# Patient Record
Sex: Female | Born: 1990
Health system: Southern US, Community
[De-identification: ages and names within clinical notes are randomized; demographics above are authoritative.]

## PROBLEM LIST (undated history)

## (undated) DIAGNOSIS — G43909 Migraine, unspecified, not intractable, without status migrainosus: Secondary | ICD-10-CM

## (undated) DIAGNOSIS — N649 Disorder of breast, unspecified: Secondary | ICD-10-CM

## (undated) DIAGNOSIS — D649 Anemia, unspecified: Secondary | ICD-10-CM

## (undated) DIAGNOSIS — F329 Major depressive disorder, single episode, unspecified: Secondary | ICD-10-CM

## (undated) DIAGNOSIS — N83209 Unspecified ovarian cyst, unspecified side: Secondary | ICD-10-CM

## (undated) DIAGNOSIS — Z5189 Encounter for other specified aftercare: Secondary | ICD-10-CM

## (undated) DIAGNOSIS — F32A Depression, unspecified: Secondary | ICD-10-CM

## (undated) HISTORY — DX: Disorder of breast, unspecified: N64.9

## (undated) HISTORY — DX: Encounter for other specified aftercare: Z51.89

## (undated) HISTORY — DX: Anemia, unspecified: D64.9

## (undated) HISTORY — DX: Migraine, unspecified, not intractable, without status migrainosus: G43.909

## (undated) HISTORY — DX: Depression, unspecified: F32.A

## (undated) HISTORY — DX: Major depressive disorder, single episode, unspecified: F32.9

---
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
## 2008-10-08 ENCOUNTER — Emergency Department: Payer: Self-pay | Admitting: Emergency Medicine

## 2008-10-25 ENCOUNTER — Emergency Department: Payer: Self-pay | Admitting: Emergency Medicine

## 2008-10-28 ENCOUNTER — Emergency Department: Payer: Self-pay | Admitting: Emergency Medicine

## 2008-12-12 ENCOUNTER — Emergency Department: Payer: Self-pay | Admitting: Emergency Medicine

## 2008-12-26 ENCOUNTER — Emergency Department: Payer: Self-pay | Admitting: Emergency Medicine

## 2009-04-05 ENCOUNTER — Emergency Department: Payer: Self-pay | Admitting: Internal Medicine

## 2009-04-06 ENCOUNTER — Emergency Department: Payer: Self-pay | Admitting: Emergency Medicine

## 2009-04-21 ENCOUNTER — Emergency Department: Payer: Self-pay | Admitting: Emergency Medicine

## 2009-04-24 ENCOUNTER — Emergency Department: Payer: Self-pay | Admitting: Emergency Medicine

## 2009-07-15 ENCOUNTER — Encounter: Payer: Self-pay | Admitting: Maternal & Fetal Medicine

## 2009-07-29 ENCOUNTER — Encounter: Payer: Self-pay | Admitting: Maternal & Fetal Medicine

## 2009-08-12 ENCOUNTER — Encounter: Payer: Self-pay | Admitting: Maternal & Fetal Medicine

## 2009-08-24 ENCOUNTER — Emergency Department: Payer: Self-pay | Admitting: Emergency Medicine

## 2011-05-29 ENCOUNTER — Emergency Department: Payer: Self-pay | Admitting: Emergency Medicine

## 2011-08-02 ENCOUNTER — Emergency Department: Payer: Self-pay | Admitting: Unknown Physician Specialty

## 2011-08-02 LAB — URINALYSIS, COMPLETE
Bacteria: NONE SEEN
Glucose,UR: NEGATIVE mg/dL (ref 0–75)
Ph: 5 (ref 4.5–8.0)
Protein: NEGATIVE
RBC,UR: 32 /HPF (ref 0–5)

## 2011-08-02 LAB — COMPREHENSIVE METABOLIC PANEL
Anion Gap: 6 — ABNORMAL LOW (ref 7–16)
BUN: 13 mg/dL (ref 7–18)
Bilirubin,Total: 0.2 mg/dL (ref 0.2–1.0)
Calcium, Total: 9.2 mg/dL (ref 8.5–10.1)
Chloride: 107 mmol/L (ref 98–107)
Creatinine: 0.84 mg/dL (ref 0.60–1.30)
EGFR (African American): >60
Osmolality: 283 (ref 275–301)
Potassium: 4.1 mmol/L (ref 3.5–5.1)
SGOT(AST): 20 U/L (ref 15–37)
Sodium: 142 mmol/L (ref 136–145)
Total Protein: 7.8 g/dL (ref 6.4–8.2)

## 2011-08-02 LAB — CBC WITH DIFFERENTIAL/PLATELET
Basophil %: 1.1 %
Eosinophil %: 2.7 %
HCT: 38.8 % (ref 35.0–47.0)
Lymphocyte #: 1.2 10*3/uL (ref 1.0–3.6)
MCV: 84 fL (ref 80–100)
Monocyte #: 0.3 x10 3/mm (ref 0.2–0.9)
Monocyte %: 9.3 %
Neutrophil #: 1.5 10*3/uL (ref 1.4–6.5)
RBC: 4.61 10*6/uL (ref 3.80–5.20)
RDW: 13.6 % (ref 11.5–14.5)
WBC: 3.1 10*3/uL — ABNORMAL LOW (ref 3.6–11.0)

## 2011-08-02 LAB — PREGNANCY, URINE: Pregnancy Test, Urine: NEGATIVE m[IU]/mL

## 2011-08-15 ENCOUNTER — Emergency Department: Payer: Self-pay | Admitting: Emergency Medicine

## 2011-08-15 LAB — COMPREHENSIVE METABOLIC PANEL
Albumin: 3.8 g/dL (ref 3.4–5.0)
Alkaline Phosphatase: 92 U/L (ref 50–136)
Bilirubin,Total: 0.3 mg/dL (ref 0.2–1.0)
Calcium, Total: 9.4 mg/dL (ref 8.5–10.1)
Chloride: 107 mmol/L (ref 98–107)
Co2: 28 mmol/L (ref 21–32)
Creatinine: 2.01 mg/dL — ABNORMAL HIGH (ref 0.60–1.30)
Osmolality: 287 (ref 275–301)
Sodium: 142 mmol/L (ref 136–145)

## 2011-08-15 LAB — URINALYSIS, COMPLETE
Bacteria: NONE SEEN
Glucose,UR: NEGATIVE mg/dL (ref 0–75)
Ketone: NEGATIVE
Nitrite: NEGATIVE
RBC,UR: 6 /HPF (ref 0–5)
Specific Gravity: 1.004 (ref 1.003–1.030)
WBC UR: 28 /HPF (ref 0–5)

## 2011-08-15 LAB — CBC
MCV: 84 fL (ref 80–100)
Platelet: 239 10*3/uL (ref 150–440)
RDW: 12.8 % (ref 11.5–14.5)
WBC: 7.9 10*3/uL (ref 3.6–11.0)

## 2011-08-15 LAB — PREGNANCY, URINE: Pregnancy Test, Urine: NEGATIVE m[IU]/mL

## 2012-05-25 ENCOUNTER — Emergency Department: Payer: Self-pay | Admitting: Unknown Physician Specialty

## 2012-10-04 ENCOUNTER — Emergency Department: Payer: Self-pay | Admitting: Internal Medicine

## 2012-10-14 ENCOUNTER — Emergency Department: Payer: Self-pay | Admitting: Emergency Medicine

## 2012-10-14 LAB — COMPREHENSIVE METABOLIC PANEL
Albumin: 4 g/dL (ref 3.4–5.0)
Alkaline Phosphatase: 90 U/L (ref 50–136)
BUN: 16 mg/dL (ref 7–18)
Bilirubin,Total: 0.3 mg/dL (ref 0.2–1.0)
Calcium, Total: 9.1 mg/dL (ref 8.5–10.1)
Chloride: 107 mmol/L (ref 98–107)
Co2: 27 mmol/L (ref 21–32)
Creatinine: 0.88 mg/dL (ref 0.60–1.30)
Osmolality: 273 (ref 275–301)
SGOT(AST): 19 U/L (ref 15–37)
SGPT (ALT): 19 U/L (ref 12–78)
Sodium: 136 mmol/L (ref 136–145)
Total Protein: 8.3 g/dL — ABNORMAL HIGH (ref 6.4–8.2)

## 2012-10-14 LAB — CBC
HCT: 40.2 % (ref 35.0–47.0)
HGB: 13.6 g/dL (ref 12.0–16.0)
Platelet: 254 10*3/uL (ref 150–440)
RDW: 13.7 % (ref 11.5–14.5)

## 2012-10-14 LAB — URINALYSIS, COMPLETE
Bacteria: NONE SEEN
Blood: NEGATIVE
Ketone: NEGATIVE
Nitrite: NEGATIVE
WBC UR: 8 /HPF (ref 0–5)

## 2013-08-08 ENCOUNTER — Emergency Department: Payer: Self-pay | Admitting: Emergency Medicine

## 2013-08-08 LAB — CBC
HCT: 41.7 % (ref 35.0–47.0)
HGB: 13.4 g/dL (ref 12.0–16.0)
MCH: 27.9 pg (ref 26.0–34.0)
MCHC: 32.2 g/dL (ref 32.0–36.0)
MCV: 87 fL (ref 80–100)
Platelet: 218 10*3/uL (ref 150–440)
RBC: 4.8 10*6/uL (ref 3.80–5.20)
RDW: 13.3 % (ref 11.5–14.5)
WBC: 9.5 10*3/uL (ref 3.6–11.0)

## 2013-08-08 LAB — URINALYSIS, COMPLETE
BACTERIA: NONE SEEN
Bilirubin,UR: NEGATIVE
Blood: NEGATIVE
Glucose,UR: NEGATIVE mg/dL (ref 0–75)
Ketone: NEGATIVE
LEUKOCYTE ESTERASE: NEGATIVE
NITRITE: NEGATIVE
PROTEIN: NEGATIVE
Ph: 6 (ref 4.5–8.0)
RBC,UR: 2 /HPF (ref 0–5)
SPECIFIC GRAVITY: 1.032 (ref 1.003–1.030)
WBC UR: 1 /HPF (ref 0–5)

## 2013-08-08 LAB — HCG, QUANTITATIVE, PREGNANCY: Beta Hcg, Quant.: 105869 m[IU]/mL — ABNORMAL HIGH

## 2013-08-08 LAB — WET PREP, GENITAL

## 2013-08-09 LAB — GC/CHLAMYDIA PROBE AMP

## 2013-08-22 ENCOUNTER — Emergency Department: Payer: Self-pay | Admitting: Emergency Medicine

## 2013-08-22 LAB — CBC
HCT: 39.8 % (ref 35.0–47.0)
HGB: 13 g/dL (ref 12.0–16.0)
MCH: 28.1 pg (ref 26.0–34.0)
MCHC: 32.7 g/dL (ref 32.0–36.0)
MCV: 86 fL (ref 80–100)
Platelet: 256 10*3/uL (ref 150–440)
RBC: 4.62 10*6/uL (ref 3.80–5.20)
RDW: 13.4 % (ref 11.5–14.5)
WBC: 8.3 10*3/uL (ref 3.6–11.0)

## 2013-08-22 LAB — URINALYSIS, COMPLETE
BILIRUBIN, UR: NEGATIVE
KETONE: NEGATIVE
Leukocyte Esterase: NEGATIVE
Nitrite: NEGATIVE
Ph: 6 (ref 4.5–8.0)
Protein: 30
Specific Gravity: 1.027 (ref 1.003–1.030)
Squamous Epithelial: 11
WBC UR: 5 /HPF (ref 0–5)

## 2013-08-22 LAB — COMPREHENSIVE METABOLIC PANEL
ALK PHOS: 73 U/L
AST: 24 U/L (ref 15–37)
Albumin: 3.2 g/dL — ABNORMAL LOW (ref 3.4–5.0)
Anion Gap: 11 (ref 7–16)
BUN: 8 mg/dL (ref 7–18)
Bilirubin,Total: 0.2 mg/dL (ref 0.2–1.0)
CHLORIDE: 104 mmol/L (ref 98–107)
CO2: 23 mmol/L (ref 21–32)
CREATININE: 0.86 mg/dL (ref 0.60–1.30)
Calcium, Total: 8.9 mg/dL (ref 8.5–10.1)
EGFR (African American): >60
EGFR (Non-African Amer.): >60
Glucose: 109 mg/dL — ABNORMAL HIGH (ref 65–99)
Osmolality: 275 (ref 275–301)
Potassium: 3.5 mmol/L (ref 3.5–5.1)
SGPT (ALT): 27 U/L
Sodium: 138 mmol/L (ref 136–145)
Total Protein: 7.7 g/dL (ref 6.4–8.2)

## 2013-08-22 LAB — HCG, QUANTITATIVE, PREGNANCY: BETA HCG, QUANT.: 98552 m[IU]/mL — AB

## 2013-11-16 ENCOUNTER — Observation Stay: Payer: Self-pay | Admitting: Certified Nurse Midwife

## 2013-11-16 LAB — URINALYSIS, COMPLETE
BILIRUBIN, UR: NEGATIVE
BLOOD: NEGATIVE
Glucose,UR: NEGATIVE mg/dL (ref 0–75)
Ketone: NEGATIVE
Nitrite: NEGATIVE
Ph: 6 (ref 4.5–8.0)
Protein: 30
RBC,UR: 6 /HPF (ref 0–5)
Specific Gravity: 1.028 (ref 1.003–1.030)

## 2013-11-17 LAB — URINE CULTURE

## 2014-02-05 ENCOUNTER — Ambulatory Visit: Payer: Self-pay | Admitting: Oncology

## 2014-02-06 ENCOUNTER — Ambulatory Visit: Payer: Self-pay | Admitting: Oncology

## 2014-02-06 LAB — CBC CANCER CENTER
BASOS ABS: 0 x10 3/mm (ref 0.0–0.1)
BASOS PCT: 0.4 %
EOS ABS: 0.1 x10 3/mm (ref 0.0–0.7)
Eosinophil %: 0.5 %
HCT: 31.1 % — AB (ref 35.0–47.0)
HGB: 9.8 g/dL — AB (ref 12.0–16.0)
LYMPHS ABS: 1.2 x10 3/mm (ref 1.0–3.6)
Lymphocyte %: 10.2 %
MCH: 24.6 pg — ABNORMAL LOW (ref 26.0–34.0)
MCHC: 31.6 g/dL — AB (ref 32.0–36.0)
MCV: 78 fL — AB (ref 80–100)
Monocyte #: 0.7 x10 3/mm (ref 0.2–0.9)
Monocyte %: 5.7 %
NEUTROS ABS: 9.7 x10 3/mm — AB (ref 1.4–6.5)
Neutrophil %: 83.2 %
Platelet: 254 x10 3/mm (ref 150–440)
RBC: 3.98 10*6/uL (ref 3.80–5.20)
RDW: 15.7 % — ABNORMAL HIGH (ref 11.5–14.5)
WBC: 11.6 x10 3/mm — ABNORMAL HIGH (ref 3.6–11.0)

## 2014-02-06 LAB — IRON AND TIBC
Iron Bind.Cap.(Total): 538 ug/dL — ABNORMAL HIGH (ref 250–450)
Iron Saturation: 14 %
Iron: 77 ug/dL (ref 50–170)
Unbound Iron-Bind.Cap.: 461 ug/dL

## 2014-02-06 LAB — FOLATE: FOLIC ACID: 24.7 ng/mL — AB (ref 3.1–17.5)

## 2014-02-06 LAB — FERRITIN: FERRITIN (ARMC): 6 ng/mL — AB (ref 8–388)

## 2014-02-06 LAB — LACTATE DEHYDROGENASE: LDH: 131 U/L (ref 81–246)

## 2014-03-06 ENCOUNTER — Ambulatory Visit
Admit: 2014-03-06 | Disposition: A | Discharge status: Home / Self Care | Payer: Self-pay | Attending: Oncology | Admitting: Oncology

## 2014-03-10 ENCOUNTER — Observation Stay: Payer: Self-pay

## 2014-04-06 ENCOUNTER — Ambulatory Visit
Admit: 2014-04-06 | Disposition: A | Discharge status: Home / Self Care | Payer: Self-pay | Attending: Oncology | Admitting: Oncology

## 2014-05-04 DIAGNOSIS — Z30017 Encounter for initial prescription of implantable subdermal contraceptive: Secondary | ICD-10-CM | POA: Insufficient documentation

## 2014-05-15 NOTE — H&P (Signed)
L&D Evaluation:  History:  HPI 24 year old g2 P1001 with EDC=03/22/2014 by a 6wk6day ultrasound presents at 6338 2/7 weeks with c/o right temporal headache and nausea/vomiting. She has a history of common migraines. This headache is right temporal and is accompanied by blurred vision at times. Denies paresthesias or scotomata with her headache. She has been anemic with her pregnancy and states that this headache is similar to headaches she had prior to her Fe infusion that she received 2 February when her hemoglobin was 9.3gm/dl. Her vomiting started yesterday and she last vomited this AM after eating a bagel. She has kept down some fluids but her urine is very concentrated looking. She has had nausea and vomiting in the past with some of her migraines. No diarhea, dysuria, blood in vomitus, or fever. Last BM was 2 days ago. [facility] remarkable for first trimester bleeding, migraines, and anemia.   Presents with nausea/vomiting, and headache   Patient's Medical History Anemia, common migraines   Patient's Surgical History none   Medications Pre Natal Vitamins   Allergies NKDA   Social History none   Family History Non-Contributory   ROS:  ROS see HPI   Exam:  Vital Signs stable  123/74 107/60   Urine Protein trace   General no apparent distress   Mental Status clear   Chest clear   Heart normal sinus rhythm, no murmur/gallop/rubs   Abdomen gravid, non-tender   Estimated Fetal Weight Average for gestational age   Fetal Position cephalic   Edema no edema   Reflexes 1+   Mebranes Intact   FHT normal rate with no decels, 140s baseline with accels to 160s   Ucx occasional   Skin dry   Impression:  Impression IUP at 38 2/7 weeks with headache and nausea/vomiting.   Plan:  Plan EFM/NST, monitor contractions and for cervical change, monitor BP, IV fluids and IV Zofran. BMP, CBC, UA.   Electronic Signatures: Trinna BalloonGutierrez, Freja Faro L (CNM)   (Signed 05-Mar-16 14:43)  Authored: L&D Evaluation   Last Updated: 05-Mar-16 14:43 by Trinna BalloonGutierrez, Lorine Iannaccone L (CNM)

## 2014-05-15 NOTE — H&P (Signed)
L&D Evaluation:  History:  HPI Pt is a 24 yo G2P1001 at 22.[redacted] weeks GA with an EDC of 03/22/14 who presents to L&D after being sent over from the office with reports of left sided pain that started this morning at work. She reports that she stands on her feet all day and that has made it worse. She was seen by Dr. Tiburcio PeaHarris where she was given pain medication and nausea medication. She reports +FM, denies vb or lof. After admission to the unit, she reports that her pain is "better" at this time. Her prenatal course is significant for migraines, vaginal bleeding, obesity with a normal 1 hr gtt, and UTI's. She has a history of a left sided ovarian cyst that is 1.7cm. She is O+, VI, RI.   Presents with abdominal pain   Patient's Medical History ovarian cyst, obesity, migraines   Patient's Surgical History none   Medications Pre Natal Vitamins   Allergies NKDA   Social History none   Family History Non-Contributory   ROS:  ROS All systems were reviewed.  HEENT, CNS, GI, GU, Respiratory, CV, Renal and Musculoskeletal systems were found to be normal.   Exam:  Vital Signs stable   General no apparent distress   Mental Status clear   Abdomen gravid, non-tender   Back no CVAT   Mebranes Intact   FHT 140's with doppler intermittent   Ucx absent   Skin dry, no lesions, no rashes   Lymph no lymphadenopathy   Other Urine- specific gravity- 1.028, negative nitrites, trace leukocytes, 30 of protein, 5 WBC's, trace bacteria, 6 RBC's, 20 epi cells, mucus present   Impression:  Impression UTI, IUP at 22.2 weeks, left lower abdominal pain since resolved   Plan:  Plan UA, monitor contractions and for cervical change, discharge, PTL precautions   Comments urine culture sent.   Follow Up Appointment already scheduled   Electronic Signatures: Jannet MantisSubudhi, Deeann Servidio (CNM)  (Signed 12-Nov-15 11:53)  Authored: L&D Evaluation   Last Updated: 12-Nov-15 11:53 by Jannet MantisSubudhi, Lakysha Kossman (CNM)

## 2014-06-13 ENCOUNTER — Other Ambulatory Visit: Payer: Self-pay

## 2014-06-13 DIAGNOSIS — O99019 Anemia complicating pregnancy, unspecified trimester: Secondary | ICD-10-CM | POA: Insufficient documentation

## 2014-06-13 DIAGNOSIS — D649 Anemia, unspecified: Secondary | ICD-10-CM

## 2014-06-14 ENCOUNTER — Ambulatory Visit: Payer: Medicaid Other

## 2014-06-14 ENCOUNTER — Inpatient Hospital Stay: Payer: Medicaid Other | Attending: Oncology

## 2014-06-14 ENCOUNTER — Ambulatory Visit: Payer: Self-pay | Admitting: Oncology

## 2014-06-14 ENCOUNTER — Other Ambulatory Visit: Payer: Self-pay | Admitting: Oncology

## 2014-07-11 ENCOUNTER — Inpatient Hospital Stay: Payer: Self-pay | Admitting: Oncology

## 2014-07-11 ENCOUNTER — Inpatient Hospital Stay: Payer: Self-pay

## 2014-07-11 ENCOUNTER — Inpatient Hospital Stay: Payer: Self-pay | Attending: Oncology

## 2014-09-13 ENCOUNTER — Encounter: Payer: Self-pay | Admitting: Emergency Medicine

## 2014-09-13 ENCOUNTER — Emergency Department
Admission: EM | Admit: 2014-09-13 | Discharge: 2014-09-13 | Disposition: A | Discharge status: Home / Self Care | Payer: Medicaid Other | Attending: Emergency Medicine | Admitting: Emergency Medicine

## 2014-09-13 DIAGNOSIS — J039 Acute tonsillitis, unspecified: Secondary | ICD-10-CM | POA: Insufficient documentation

## 2014-09-13 DIAGNOSIS — Z720 Tobacco use: Secondary | ICD-10-CM | POA: Insufficient documentation

## 2014-09-13 LAB — POCT RAPID STREP A: Streptococcus, Group A Screen (Direct): NEGATIVE

## 2014-09-13 MED ORDER — AMOXICILLIN 500 MG PO CAPS: 500.0000 mg | ORAL_CAPSULE | Freq: Three times a day (TID) | ORAL | Status: DC

## 2014-09-13 NOTE — ED Provider Notes (Signed)
[facility] Emergency Department Provider Note  ____________________________________________  Time seen: Approximately 10:31 AM  I have reviewed the triage vital signs and the nursing notes.   HISTORY  Chief Complaint Sore Throat and Fever   HPI Mary Wall is a 24 y.o. female is here complaining of sore throat and fever for approximately one week. Patient is unaware of any exposure to strep. She has not taken any medication today because states that she has had some low-grade temp at home. She has not actually taken her temperature.Patient rates her pain 8 out of 10.   History reviewed. No pertinent past medical history.  Patient Active Problem List   Diagnosis Date Noted  . Anemia 06/13/2014    History reviewed. No pertinent past surgical history.  Current Outpatient Rx  Name  Route  Sig  Dispense  Refill  . amoxicillin (AMOXIL) 500 MG capsule   Oral   Take 1 capsule (500 mg total) by mouth 3 (three) times daily.   30 capsule   0     Allergies Review of patient's allergies indicates no known allergies.  History reviewed. No pertinent family history.  Social History Social History  Substance Use Topics  . Smoking status: Current Every Day Smoker  . Smokeless tobacco: None  . Alcohol Use: No    Review of Systems Constitutional: Positive fever/chills Eyes: No visual changes. ENT: Positive sore throat. Cardiovascular: Denies chest pain. Respiratory: Denies shortness of breath. Gastrointestinal: No abdominal pain.  No nausea, no vomiting. Genitourinary: Negative for dysuria. Musculoskeletal: Negative for back pain. Skin: Negative for rash. Neurological: Negative for headaches, focal weakness or numbness.  10-point ROS otherwise negative.  ____________________________________________   PHYSICAL EXAM:  VITAL SIGNS: ED Triage Vitals  Enc Vitals Group     BP 09/13/14 0924 105/88 mmHg     Pulse Rate 09/13/14 0924 101   Resp 09/13/14 0924 18     Temp 09/13/14 0924 99.1 F (37.3 C)     Temp Source 09/13/14 0924 Oral     SpO2 09/13/14 0924 98 %     Weight 09/13/14 0924 158 lb (71.668 kg)     Height 09/13/14 0924 5\' 1"  (1.549 m)     Head Cir --      Peak Flow --      Pain Score 09/13/14 0907 8     Pain Loc --      Pain Edu? --      Excl. in GC? --     Constitutional: Alert and oriented. Well appearing and in no acute distress. Eyes: Conjunctivae are normal. PERRL. EOMI. Head: Atraumatic. Nose: No congestion/rhinnorhea. Mouth/Throat: Mucous membranes are moist.  Oropharynx non-erythematous but patient has exudative right tonsil. More involved on the right than left. Neck: No stridor.  Tender bilateral cervical lymphadenopathy Cardiovascular: Normal rate, regular rhythm. Grossly normal heart sounds.  Good peripheral circulation. Respiratory: Normal respiratory effort.  No retractions. Lungs CTAB. Gastrointestinal: Soft and nontender. No distention. No abdominal bruits. No CVA tenderness. Musculoskeletal: No lower extremity tenderness nor edema.  No joint effusions. Neurologic:  Normal speech and language. No gross focal neurologic deficits are appreciated. No gait instability. Skin:  Skin is warm, dry and intact. No rash noted. Psychiatric: Mood and affect are normal. Speech and behavior are normal.  ____________________________________________   LABS (all labs ordered are listed, but only abnormal results are displayed)  Labs Reviewed  CULTURE, GROUP A STREP ([facility] ONLY)  POCT RAPID STREP A    PROCEDURES  Procedure(s) performed: None  Critical Care performed: No  ____________________________________________   INITIAL IMPRESSION / ASSESSMENT AND PLAN / ED COURSE  Pertinent labs & imaging results that were available during my care of the patient were reviewed by me and considered in my medical decision making (see chart for details).  Patient was placed on amoxicillin 500 mg 3 times a  day for 10 days. Patient is take ibuprofen or Tylenol as needed for throat pain and encourage drink fluids. ____________________________________________   FINAL CLINICAL IMPRESSION(S) / ED DIAGNOSES  Final diagnoses:  Tonsillitis      Tommi Rumps, PA-C 09/13/14 1343  Arnaldo Natal, MD 09/13/14 [phone]

## 2014-09-13 NOTE — ED Notes (Signed)
Patient to ED with c/o sore throat and fever for about a week.

## 2014-09-16 LAB — CULTURE, GROUP A STREP (THRC)

## 2015-10-23 ENCOUNTER — Encounter: Payer: Self-pay | Admitting: *Deleted

## 2015-10-23 ENCOUNTER — Emergency Department
Admission: EM | Admit: 2015-10-23 | Discharge: 2015-10-23 | Disposition: A | Discharge status: Home / Self Care | Payer: BLUE CROSS/BLUE SHIELD | Attending: Emergency Medicine | Admitting: Emergency Medicine

## 2015-10-23 DIAGNOSIS — Z79899 Other long term (current) drug therapy: Secondary | ICD-10-CM | POA: Diagnosis not present

## 2015-10-23 DIAGNOSIS — N938 Other specified abnormal uterine and vaginal bleeding: Secondary | ICD-10-CM | POA: Diagnosis not present

## 2015-10-23 DIAGNOSIS — F17200 Nicotine dependence, unspecified, uncomplicated: Secondary | ICD-10-CM | POA: Insufficient documentation

## 2015-10-23 DIAGNOSIS — N939 Abnormal uterine and vaginal bleeding, unspecified: Secondary | ICD-10-CM | POA: Diagnosis present

## 2015-10-23 HISTORY — DX: Unspecified ovarian cyst, unspecified side: N83.209

## 2015-10-23 LAB — CBC
HEMATOCRIT: 44.4 % (ref 35.0–47.0)
Hemoglobin: 14.5 g/dL (ref 12.0–16.0)
MCH: 27.8 pg (ref 26.0–34.0)
MCHC: 32.6 g/dL (ref 32.0–36.0)
MCV: 85.4 fL (ref 80.0–100.0)
Platelets: 236 10*3/uL (ref 150–440)
RBC: 5.2 MIL/uL (ref 3.80–5.20)
RDW: 13.3 % (ref 11.5–14.5)
WBC: 6.5 10*3/uL (ref 3.6–11.0)

## 2015-10-23 LAB — POCT PREGNANCY, URINE: Preg Test, Ur: NEGATIVE

## 2015-10-23 NOTE — Discharge Instructions (Signed)
Stop taking the medication that was prescribed to you by your primary care physician. Please make an appointment with the gynecologist, who will see you for your vaginal bleeding.  Return to the emergency department if you develop severe pain, increased bleeding, shortness of breath or lightheadedness, fainting, or any other symptoms concerning to you.

## 2015-10-23 NOTE — ED Notes (Signed)
Pt verified medication with her pharmacy. She has been taking Provera 10 mg P.O. Qd X 9 days.

## 2015-10-23 NOTE — ED Provider Notes (Signed)
[facility] Emergency Department Provider Note  ____________________________________________  Time seen: Approximately 8:04 PM  I have reviewed the triage vital signs and the nursing notes.   HISTORY  Chief Complaint Vaginal Bleeding    HPI Mary Wall is a 25 y.o. female s/p Nexplanon placement April 2016 presenting with dysfunctional uterine bleeding. The patient reports that initially after Nexplanon placement, she continued to have normal periods. After several months, she started to have irregular periods, sometimes with no period for several months, as well as prolonged bleeding when she did have periods.Today, the patient is here because she has been having mild to moderate flow daily for 6 weeks with intermittent lower abdominal cramping, and is on day 9 of the Provera pill prescribed by her primary care physician, with no improvement. She attempted to call with [facility] where she gets her gynecologic care, and they are unable to see her for at least 6 weeks.  The patient denies any fever, change in vaginal discharge, lightheadedness, shortness of breath, fainting.   Past Medical History:  Diagnosis Date  . Ovarian cyst     Patient Active Problem List   Diagnosis Date Noted  . Anemia 06/13/2014    History reviewed. No pertinent surgical history.  Current Outpatient Rx  . Order #: [id number] Class: Print    Allergies Review of patient's allergies indicates no known allergies.  History reviewed. No pertinent family history.  Social History Social History  Substance Use Topics  . Smoking status: Current Every Day Smoker  . Smokeless tobacco: Not on file  . Alcohol use No    Review of Systems Constitutional: No fever/chills.No lightheadedness or syncope. Eyes: No visual changes. ENT: No sore throat. No congestion or rhinorrhea. Cardiovascular: Denies chest pain. Denies palpitations. Respiratory: Denies shortness of breath.  No  cough. Gastrointestinal: Occasional intermittent lower abdominal cramping.  No nausea, no vomiting.  No diarrhea.  No constipation. Genitourinary: Negative for dysuria. Positive for dysfunction uterine bleeding. Musculoskeletal: Negative for back pain. Skin: Negative for rash. Neurological: Negative for headaches. No focal numbness, tingling or weakness.   10-point ROS otherwise negative.  ____________________________________________   PHYSICAL EXAM:  VITAL SIGNS: ED Triage Vitals [10/23/15 1744]  Enc Vitals Group     BP (!) 133/93     Pulse Rate 83     Resp 18     Temp 98.7 F (37.1 C)     Temp Source Oral     SpO2 98 %     Weight 160 lb (72.6 kg)     Height 5\' 1"  (1.549 m)     Head Circumference      Peak Flow      Pain Score 3     Pain Loc      Pain Edu?      Excl. in GC?     Constitutional: Alert and oriented. Well appearing and in no acute distress. Answers questions appropriately. Eyes: Conjunctivae are normal and without pallor.  EOMI. No scleral icterus. Head: Atraumatic. Nose: No congestion/rhinnorhea. Mouth/Throat: Mucous membranes are moist.  Neck: No stridor.  Supple.   Cardiovascular: Normal rate, regular rhythm. No murmurs, rubs or gallops.  Respiratory: Normal respiratory effort.  No accessory muscle use or retractions. Lungs CTAB.  No wheezes, rales or ronchi. Gastrointestinal: Soft, nontender and nondistended.  No guarding or rebound.  No peritoneal signs. Musculoskeletal: No LE edema.  Neurologic:  A&Ox3.  Speech is clear.  Face and smile are symmetric.  EOMI.  Moves all  extremities well. Skin:  Skin is warm, dry and intact. No rash noted.  No pallor. Psychiatric: Mood and affect are normal. Speech and behavior are normal.  Normal judgement.  ____________________________________________   LABS (all labs ordered are listed, but only abnormal results are displayed)  Labs Reviewed  CBC  POC URINE PREG, ED  POCT PREGNANCY, URINE    ____________________________________________  EKG  Not indicated/ ____________________________________________  RADIOLOGY  No results found.  ____________________________________________   PROCEDURES  Procedure(s) performed: None  Procedures  Critical Care performed: No ____________________________________________   INITIAL IMPRESSION / ASSESSMENT AND PLAN / ED COURSE  Pertinent labs & imaging results that were available during my care of the patient were reviewed by me and considered in my medical decision making (see chart for details).  25 y.o. nonpregnant female presenting for dysfunctional uterine bleeding. Overall, the patient is well-appearing with reassuring vital signs. From triage, she had a negative pregnancy test as well as a CBC showing a stable hemoglobin and hematocrit. She is not having any signs or symptoms of anemia. She is not having significant pain that is under controlled.  I have spoken with Dr. Bonney Aid, who will help get the patient into the clinic, and recommends stopping the Provera.  Plan discharge.  ____________________________________________  FINAL CLINICAL IMPRESSION(S) / ED DIAGNOSES  Final diagnoses:  Dysfunctional uterine bleeding    Clinical Course      NEW MEDICATIONS STARTED DURING THIS VISIT:  New Prescriptions   No medications on file      Rockne Menghini, MD 10/23/15 2022

## 2015-10-23 NOTE — ED Triage Notes (Signed)
States vaginal bleeding for 5 weeks. States she has nexplanon, states she was given some medicine by her PCP but states bleeding continues, pt awake and alert in no acute distress

## 2017-01-05 HISTORY — PX: BREAST BIOPSY: SHX20

## 2017-10-20 ENCOUNTER — Encounter (INDEPENDENT_AMBULATORY_CARE_PROVIDER_SITE_OTHER): Payer: Self-pay

## 2017-10-20 ENCOUNTER — Ambulatory Visit: Payer: Self-pay | Attending: Oncology

## 2017-10-20 VITALS — BP 127/84 | HR 85 | Temp 98.9°F | Ht 62.0 in | Wt 142.0 lb

## 2017-10-20 DIAGNOSIS — N63 Unspecified lump in unspecified breast: Secondary | ICD-10-CM

## 2017-10-20 NOTE — Progress Notes (Signed)
  Subjective:     Patient ID: Mary Wall, female   DOB: [birth year], 27 y.o.   MRN: [id number]  HPI   Review of Systems     Objective:   Physical Exam  Pulmonary/Chest: Right breast exhibits mass. Right breast exhibits no inverted nipple, no nipple discharge, no skin change and no tenderness. Left breast exhibits no inverted nipple, no mass, no nipple discharge, no skin change and no tenderness. Breasts are symmetrical.  1 cm. x 2cm. firm mobile mass right breast 11:30 adjacent to areola         Assessment:     27 year old patient presents for [facility] clinic visit.  Patient reports a right breast mass x 2 months. [facility] provider Theresia Majors at [facility] placed on antibiotics "about a month ago" for 10 days.  Mass still present. Patient screened, and meets BCCCP eligibility.  Patient does not have insurance, Medicare or Medicaid.  Handout given on Affordable Care Act.  Palpated a firm, tender, mobile 1cm. X 2 cm. Mass at 11:30 right breast.  Otherwise normal breast exam.    Plan:     Joellyn Quails scheduled consult with Dr. Lemar Livings on 10/21/17 at 2:45.

## 2017-10-21 ENCOUNTER — Encounter: Payer: Self-pay | Admitting: General Surgery

## 2017-10-21 ENCOUNTER — Ambulatory Visit: Payer: Self-pay

## 2017-10-21 ENCOUNTER — Ambulatory Visit (INDEPENDENT_AMBULATORY_CARE_PROVIDER_SITE_OTHER): Payer: Self-pay | Admitting: General Surgery

## 2017-10-21 ENCOUNTER — Other Ambulatory Visit: Payer: Self-pay

## 2017-10-21 VITALS — BP 116/79 | HR 76 | Temp 97.5°F | Resp 14 | Ht 62.0 in | Wt 147.2 lb

## 2017-10-21 DIAGNOSIS — N6310 Unspecified lump in the right breast, unspecified quadrant: Secondary | ICD-10-CM

## 2017-10-21 NOTE — Progress Notes (Signed)
Per Elon Jester at Dr. Rutherford Nail office, patient is scheduled for biopsy in the office on 11/02/17.

## 2017-10-21 NOTE — Patient Instructions (Addendum)
Patient needs to be scheduled for a breast biopsy.   Breast Biopsy A breast biopsy is a procedure in which a sample of suspicious breast tissue is removed from your breast. Following the procedure, the tissue or liquid that is removed from the breast is examined under a microscope to see if cancerous cells are present. You may need a breast biopsy if you have:  Any undiagnosed breast mass (tumor).  Nipple abnormalities, dimpling, crusting, or ulcerations.  Abnormal discharge from the nipple, especially blood.  Redness, swelling, and pain of the breast.  Calcium deposits (calcifications) or abnormalities seen on a mammogram, ultrasound results, or MRI results.  Suspicious changes in the breast seen on your mammogram.  If the breast abnormality is found to be cancerous (malignant), a breast biopsy can help to determine what the best treatment is for you. There are many different types of breast biopsies. Talk with your health care provider about your options and which type is best for you. Tell a health care provider about:  Any allergies you have.  All medicines you are taking, including vitamins, herbs, eye drops, creams, and over-the-counter medicines.  Any problems you or family members have had with anesthetic medicines.  Any blood disorders you have.  Any surgeries you have had.  Any medical conditions you have.  Whether you are pregnant or may be pregnant. What are the risks? Generally, this is a safe procedure. However, problems may occur, including:  Bleeding.  Infection.  Discomfort. This is temporary.  Allergic reactions to medicines.  Bruising and swelling of the breast.  Alteration in the shape of the breast.  Damage to other tissues.  Not finding the lump or abnormality.  Needing more surgery.  What happens before the procedure?  Plan to have someone take you home after the procedure.  Do not use any tobacco products, such as cigarettes, chewing  tobacco, and e-cigarettes. If you need help quitting, ask your health care provider.  Do not drink alcohol for 24 hours before the procedure.  Ask your health care provider about: ? Changing or stopping your regular medicines. This is especially important if you are taking diabetes medicines or blood thinners. ? Taking medicines such as aspirin and ibuprofen. These medicines can thin your blood. Do not take these medicines before your procedure if your health care provider instructs you not to.  Wear a good support bra to the procedure.  Ask your health care provider how your surgical site will be marked or identified.  You may be given antibiotic medicine to help prevent infection.  Your health care provider may perform a procedure to place a wire (needle localization) or a seed that gives off radiation (radioactive seed localization) in the breast lump. A mammogram, ultrasound, MRI, or a combination of these techniques will be done during this procedure to identify the location of the breast abnormality. The imaging technique used will depend on the type of biopsy you are having. The wire or seed will help the health care provider locate the lump when performing the biopsy, especially if the lump cannot be felt. What happens during the procedure? You may be given one or both of the following:  A medicine to numb the breast area (local anesthetic).  A medicine to help you relax (sedative) during the procedure.  The following are the different types of biopsies that can be performed. Fine-Needle Aspiration A thin needle will be attached to a syringe and inserted into a breast cyst. Fluid and cells  will be removed. This technique is not as common as a core needle biopsy. Core Needle Biopsy A wide, hollow needle (core needle) will be inserted into a breast lump multiple times to remove tissue samples or cores. Stereotactic Biopsy You will lie face-down on a table. Your breast will pass  through an opening in the table and will be gently compressed into a fixed position. X-ray equipment and a computer will be used to locate the breast lump. The surgeon will use this information to collect several samples of tissue using a needle collection device. Vacuum-Assisted Biopsy A small incision (less than  inch) will be made in your breast. A biopsy device that includes a hollow needle and vacuum will be passed through the incision and into the breast tissue. The vacuum will gently draw abnormal breast tissue into the needle to remove it. No stitches (sutures) will be needed. The incision will be covered with a bandage (dressing). In this type of biopsy, a larger tissue sample is removed than in a regular core needle biopsy. Ultrasound-Guided Core Needle Biopsy A high-frequency ultrasound will be used to help guide the core needle to the area of the mass or abnormality. An incision will be made to insert the needle. Then tissue samples will be removed. Surgical Biopsy This method requires an incision in the breast to remove part or all of the suspicious tissue. After the tissue is removed, the skin over the area will be closed with sutures and covered with a dressing. There are two types of surgical biopsies:  Incisional biopsy. The surgeon will remove part of the breast lump.  Excisional biopsy. The surgeon will attempt to remove the whole breast lump or as much of it as possible.  After any of these procedures, the tissue or liquid that was removed will be examined under a microscope. What happens after the procedure?  You will be taken to the recovery area. If you are doing well and have no problems, you will be allowed to go home.  You may notice bruising on your breast. This is normal.  You may have a pressure dressing applied on your breast for 24-48 hours. A pressure dressing is a bandage that is wrapped tightly around the chest to stop fluid from collecting underneath tissues.  You may also be advised to wear a supportive bra during this time.  Do not drive for 24 hours if you received a sedative. This information is not intended to replace advice given to you by your health care provider. Make sure you discuss any questions you have with your health care provider. Document Released: 12/22/2004 Document Revised: 05/02/2015 Document Reviewed: 09/25/2014 Elsevier Interactive Patient Education  [employer].

## 2017-10-21 NOTE — Progress Notes (Signed)
Patient ID: Mary Wall, female   DOB: [birth year], 27 y.o.   MRN: [id number]  Chief Complaint  Patient presents with  . New Patient (Initial Visit)    right breast mass x45months- no studies    HPI Mary Wall is a 27 y.o. female here today for right breast mass for 2 months. No studies have been performed patient states that the pain comes and goes and has experinced some burning.  The patient is a mother of 2 boys age 70 and 37.  She is living with the father of the younger child.  She is a Production designer, theatre/television/film for [employer].  No past breast problems.  She does not know much better father's side of the family. HPI  Past Medical History:  Diagnosis Date  . Anemia   . Depression   . Migraines   . Ovarian cyst     History reviewed. No pertinent surgical history.  Family History  Problem Relation Age of Onset  . Cancer Maternal Grandfather     Social History Social History   Tobacco Use  . Smoking status: Current Every Day Smoker  . Smokeless tobacco: Never Used  Substance Use Topics  . Alcohol use: No  . Drug use: Yes    Types: Marijuana    No Known Allergies  Current Outpatient Medications  Medication Sig Dispense Refill  . butalbital-acetaminophen-caffeine (FIORICET WITH CODEINE) 50-325-40-30 MG capsule Take by mouth.     No current facility-administered medications for this visit.     Review of Systems Review of Systems  Constitutional: Negative.   Respiratory: Negative.   Cardiovascular: Negative.     Blood pressure 116/79, pulse 76, temperature (!) 97.5 F (36.4 C), temperature source Temporal, resp. rate 14, height 5\' 2"  (1.575 m), weight 147 lb 3.2 oz (66.8 kg), SpO2 100 %.  Physical Exam Physical Exam  Constitutional: She is oriented to person, place, and time. She appears well-developed and well-nourished.  Eyes: Conjunctivae are normal. No scleral icterus.  Cardiovascular: Normal rate and regular rhythm.  Pulmonary/Chest: Effort normal and breath  sounds normal. Right breast exhibits no inverted nipple, no mass, no nipple discharge, no skin change and no tenderness. Left breast exhibits no inverted nipple, no mass, no nipple discharge, no skin change and no tenderness. Breasts are symmetrical.    Lymphadenopathy:    She has no cervical adenopathy.    She has no axillary adenopathy.       Right: No supraclavicular adenopathy present.  Neurological: She is alert and oriented to person, place, and time.  Skin: Skin is warm and dry.     Data Reviewed Ultrasound examination of the right breast in the area of palpable abnormality, 12 o'clock position, 3 cm from the nipple showed a complex cystic and solid lesion with areas of posterior acoustic enhancement as well as focal solid areas measuring up to 0.8 cm with faint posterior shadowing.  The mass is not well-defined.  This does not appear to extend below the pectoralis muscle.  Does not quite reach the deep layer of the dermis.  No increased vascular flow is noted.  BI-RADS-4  Assessment    Unusual complex mass of the right breast.  No history of trauma.    Plan Patient needs to be scheduled for an Encor breast biopsy.  This will be done at the time to minimize disruption of her responsibilities for her 2 young boys and her job.  Procedure was reviewed.  HPI, Physical Exam, Assessment and Plan have  been scribed under the direction and in the presence of Donnalee Curry, Md.  Buddy Duty R. Rubye Oaks, CMA   I have completed the exam and reviewed the above documentation for accuracy and completeness.  I agree with the above.  Museum/gallery conservator has been used and any errors in dictation or transcription are unintentional.  Donnalee Curry, M.D., F.A.C.S.   Merrily Pew Byrnett 10/22/2017, 1:52 PM

## 2017-10-22 DIAGNOSIS — N6310 Unspecified lump in the right breast, unspecified quadrant: Secondary | ICD-10-CM | POA: Insufficient documentation

## 2017-11-02 ENCOUNTER — Other Ambulatory Visit: Payer: Self-pay

## 2017-11-02 ENCOUNTER — Other Ambulatory Visit: Payer: Self-pay | Admitting: General Surgery

## 2017-11-02 ENCOUNTER — Ambulatory Visit (INDEPENDENT_AMBULATORY_CARE_PROVIDER_SITE_OTHER): Payer: Self-pay | Admitting: General Surgery

## 2017-11-02 ENCOUNTER — Ambulatory Visit: Payer: Self-pay

## 2017-11-02 ENCOUNTER — Encounter: Payer: Self-pay | Admitting: General Surgery

## 2017-11-02 VITALS — BP 144/93 | HR 96 | Temp 97.7°F | Resp 18 | Ht 61.0 in | Wt 146.0 lb

## 2017-11-02 DIAGNOSIS — N6311 Unspecified lump in the right breast, upper outer quadrant: Secondary | ICD-10-CM

## 2017-11-02 DIAGNOSIS — N6310 Unspecified lump in the right breast, unspecified quadrant: Secondary | ICD-10-CM

## 2017-11-02 NOTE — Progress Notes (Signed)
Patient ID: Mary Wall, female   DOB: [birth year], 27 y.o.   MRN: [id number]  Chief Complaint  Patient presents with  . Follow-up    HPI SHAQUOYA Wall is a 27 y.o. female here today for a right breast biopsy.  HPI  Past Medical History:  Diagnosis Date  . Anemia   . Depression   . Migraines   . Ovarian cyst     History reviewed. No pertinent surgical history.  Family History  Problem Relation Age of Onset  . Cancer Maternal Grandfather     Social History Social History   Tobacco Use  . Smoking status: Current Every Day Smoker  . Smokeless tobacco: Never Used  Substance Use Topics  . Alcohol use: No  . Drug use: Yes    Types: Marijuana    No Known Allergies  Current Outpatient Medications  Medication Sig Dispense Refill  . butalbital-acetaminophen-caffeine (FIORICET WITH CODEINE) 50-325-40-30 MG capsule Take by mouth.     No current facility-administered medications for this visit.     Review of Systems Review of Systems  Blood pressure (!) 144/93, pulse 96, temperature 97.7 F (36.5 C), temperature source Skin, resp. rate 18, height 5\' 1"  (1.549 m), weight 146 lb (66.2 kg), SpO2 98 %.  Physical Exam Physical Exam  Pulmonary/Chest:      Data Reviewed Biopsy procedure was reviewed and the patient was amenable to proceed.  She was accompanied by her mother who is an LPN in the [employer] system.  The area was cleansed with alcohol followed by 10 cc of 0.5% Xylocaine with 0.25% Marcaine with 1 to 200,000 units of epinephrine.  This was well-tolerated.  ChloraPrep was applied to the skin.  A 10-gauge Encor device was advanced along the lateral-medial pathway into the central portion of the lesion.  7 core samples were obtained scant, transient discomfort.  No discernible bleeding.  Post biopsy clip placed.  Skin incision closed with benzoin and Steri-Strip followed by Telfa and Tegaderm dressing.  Ice pack provided.  The procedure was  well-tolerated.  Assessment    Atypical right breast mass.    Plan  Wound care reviewed.  Patient will be notified tomorrow when pathology is available.  HPI, Physical Exam, Assessment and Plan have been scribed under the direction and in the presence of Donnalee Curry, MD.  Ples Specter, CMA  I have completed the exam and reviewed the above documentation for accuracy and completeness.  I agree with the above.  Museum/gallery conservator has been used and any errors in dictation or transcription are unintentional.  Donnalee Curry, M.D., F.A.C.S.  Merrily Pew Undrea Shipes 11/02/2017, 9:35 PM

## 2017-11-02 NOTE — Patient Instructions (Signed)

## 2017-11-03 ENCOUNTER — Telehealth: Payer: Self-pay | Admitting: General Surgery

## 2017-11-03 NOTE — Telephone Encounter (Signed)
Patient is calling to see if her results have came in yet, she just got her phone fixed and wasn't sure if anyone has tried to call her. She can be reached at [phone]. Please call patient and advise.

## 2017-11-03 NOTE — Telephone Encounter (Signed)
Called patient back to let her know that her pathology report is still not back. Patient was notified that once report was back and Dr. Lemar Livings reviews it, then we would call her back. Patient understood and agreed and had no further questions.

## 2017-11-04 LAB — PATHOLOGY

## 2017-11-05 ENCOUNTER — Telehealth: Payer: Self-pay

## 2017-11-05 NOTE — Telephone Encounter (Signed)
Notified patient as instructed, patient pleased. Still having burning in right breast and would like to discuss at follow up. Discussed follow-up appointments, patient agrees

## 2017-11-05 NOTE — Telephone Encounter (Signed)
-----   Message from Earline Mayotte, MD sent at 11/04/2017  4:35 PM EDT ----- Please notify the biopsy was fine. No cancer.  F/u with me as scheduled (if no appointment, make it for 2-3 weeks). Thanks.  ----- Message ----- From: [facility] Results In Sent: 11/04/2017  12:35 PM EDT To: Earline Mayotte, MD

## 2017-11-18 ENCOUNTER — Ambulatory Visit (INDEPENDENT_AMBULATORY_CARE_PROVIDER_SITE_OTHER): Payer: Self-pay | Admitting: General Surgery

## 2017-11-18 ENCOUNTER — Other Ambulatory Visit: Payer: Self-pay

## 2017-11-18 ENCOUNTER — Encounter: Payer: Self-pay | Admitting: General Surgery

## 2017-11-18 VITALS — BP 142/88 | HR 90 | Temp 97.9°F | Resp 12 | Ht 61.0 in | Wt 146.0 lb

## 2017-11-18 DIAGNOSIS — N6311 Unspecified lump in the right breast, upper outer quadrant: Secondary | ICD-10-CM

## 2017-11-18 NOTE — Patient Instructions (Addendum)
The patient is aware to use a heating pad as needed for comfort.Return in three months. Patient to take 2 Advil two times as needed. . The patient is aware to call back for any questions or concerns.

## 2017-11-18 NOTE — Progress Notes (Signed)
Patient ID: Mary Wall, female   DOB: 1990-09-30, 27 y.o.   MRN: [id number]  Chief Complaint  Patient presents with  . Follow-up    HPI Mary Wall is a 27 y.o. female here today for her follow up right breast biopsy done on 11/02/2017. Patient states she is having burning in her right breast. She states the burning is more frequent Mom is present at visit.   Past Medical History:  Diagnosis Date  . Anemia   . Depression   . Migraines   . Ovarian cyst     No past surgical history on file.  Family History  Problem Relation Age of Onset  . Cancer Maternal Grandfather     Social History Social History   Tobacco Use  . Smoking status: Current Every Day Smoker  . Smokeless tobacco: Never Used  Substance Use Topics  . Alcohol use: No  . Drug use: Yes    Types: Marijuana    No Known Allergies  Current Outpatient Medications  Medication Sig Dispense Refill  . butalbital-acetaminophen-caffeine (FIORICET WITH CODEINE) 50-325-40-30 MG capsule Take by mouth.     No current facility-administered medications for this visit.     Review of Systems Review of Systems  Constitutional: Negative.   Respiratory: Negative.   Cardiovascular: Negative.     Blood pressure (!) 142/88, pulse 90, temperature 97.9 F (36.6 C), temperature source Skin, resp. rate 12, height 5\' 1"  (1.549 m), weight 146 lb (66.2 kg).  Physical Exam Physical Exam  Constitutional: She is oriented to person, place, and time. She appears well-developed and well-nourished.  Pulmonary/Chest:    Neurological: She is alert and oriented to person, place, and time.  Skin: Skin is warm and dry.    Data Reviewed November 02, 2017 biopsy BREAST, 11:00, RIGHT:  FIBROCYSTIC CHANGES.  PSEUDOANGIOMATOUS STROMAL HYPERPLASIA AND COLUMNAR CELL CHANGE.  PERIDUCTAL CHRONIC INFLAMMATION.  XDB 11/04/2017 1123 Local   Assessment The biopsy site is healing without evidence of inflammation or induration.   She may be experiencing pain based on the chronic inflammation noted on the biopsy.  Observation alone is warranted at this time.    Plan  The patient is aware to use a heating pad as needed for comfort.Return in three months. Patient to take 2 Advil two times as needed. . The patient is aware to call back for any questions or concerns.  HPI, Physical Exam, Assessment and Plan have been scribed under the direction and in the presence of Donnalee CurryJeffrey Ewald Beg, MD.  Mary Wall, Mary Wall  I have completed the exam and reviewed the above documentation for accuracy and completeness.  I agree with the above.  Museum/gallery conservatorDragon Technology has been used and any errors in dictation or transcription are unintentional.  Donnalee CurryJeffrey Caria Transue, M.D., F.A.C.S.  Merrily PewJeffrey W Amaree Loisel 11/22/2017, 8:13 PM

## 2017-12-05 NOTE — Progress Notes (Signed)
Patient had benign right breast biopsy with Dr. Lemar LivingsByrnett.  To follow up in his office in 3 months per note.

## 2018-02-17 ENCOUNTER — Ambulatory Visit: Payer: Self-pay | Admitting: General Surgery

## 2018-03-15 DIAGNOSIS — F419 Anxiety disorder, unspecified: Secondary | ICD-10-CM | POA: Diagnosis not present

## 2018-03-15 DIAGNOSIS — E7800 Pure hypercholesterolemia, unspecified: Secondary | ICD-10-CM | POA: Diagnosis not present

## 2018-03-15 DIAGNOSIS — F329 Major depressive disorder, single episode, unspecified: Secondary | ICD-10-CM | POA: Diagnosis not present

## 2018-03-15 DIAGNOSIS — Z1331 Encounter for screening for depression: Secondary | ICD-10-CM | POA: Diagnosis not present

## 2018-03-15 DIAGNOSIS — E559 Vitamin D deficiency, unspecified: Secondary | ICD-10-CM | POA: Diagnosis not present

## 2018-03-15 DIAGNOSIS — G43909 Migraine, unspecified, not intractable, without status migrainosus: Secondary | ICD-10-CM | POA: Diagnosis not present

## 2018-03-21 DIAGNOSIS — F331 Major depressive disorder, recurrent, moderate: Secondary | ICD-10-CM | POA: Diagnosis not present

## 2018-06-23 ENCOUNTER — Encounter: Payer: Self-pay | Admitting: General Surgery

## 2018-12-20 DIAGNOSIS — F419 Anxiety disorder, unspecified: Secondary | ICD-10-CM | POA: Diagnosis not present

## 2018-12-20 DIAGNOSIS — N644 Mastodynia: Secondary | ICD-10-CM | POA: Diagnosis not present

## 2018-12-20 DIAGNOSIS — N6310 Unspecified lump in the right breast, unspecified quadrant: Secondary | ICD-10-CM | POA: Diagnosis not present

## 2019-02-04 ENCOUNTER — Emergency Department: Payer: Worker's Compensation

## 2019-02-04 ENCOUNTER — Other Ambulatory Visit: Payer: Self-pay

## 2019-02-04 ENCOUNTER — Emergency Department
Admission: EM | Admit: 2019-02-04 | Discharge: 2019-02-04 | Disposition: A | Discharge status: Home / Self Care | Payer: Worker's Compensation | Attending: Emergency Medicine | Admitting: Emergency Medicine

## 2019-02-04 ENCOUNTER — Encounter: Payer: Self-pay | Admitting: Intensive Care

## 2019-02-04 DIAGNOSIS — W010XXA Fall on same level from slipping, tripping and stumbling without subsequent striking against object, initial encounter: Secondary | ICD-10-CM | POA: Insufficient documentation

## 2019-02-04 DIAGNOSIS — Y9389 Activity, other specified: Secondary | ICD-10-CM | POA: Diagnosis not present

## 2019-02-04 DIAGNOSIS — F1210 Cannabis abuse, uncomplicated: Secondary | ICD-10-CM | POA: Diagnosis not present

## 2019-02-04 DIAGNOSIS — Y92511 Restaurant or cafe as the place of occurrence of the external cause: Secondary | ICD-10-CM | POA: Insufficient documentation

## 2019-02-04 DIAGNOSIS — F17210 Nicotine dependence, cigarettes, uncomplicated: Secondary | ICD-10-CM | POA: Insufficient documentation

## 2019-02-04 DIAGNOSIS — S8992XA Unspecified injury of left lower leg, initial encounter: Secondary | ICD-10-CM | POA: Diagnosis present

## 2019-02-04 DIAGNOSIS — S83412A Sprain of medial collateral ligament of left knee, initial encounter: Secondary | ICD-10-CM | POA: Diagnosis not present

## 2019-02-04 DIAGNOSIS — Y990 Civilian activity done for income or pay: Secondary | ICD-10-CM | POA: Diagnosis not present

## 2019-02-04 MED ORDER — IBUPROFEN 800 MG PO TABS: 800.0000 mg | ORAL_TABLET | Freq: Three times a day (TID) | ORAL | 0 refills | Status: DC | PRN

## 2019-02-04 NOTE — ED Triage Notes (Signed)
Patient reports slipping on ice in freezer at [employer] last night. Reports she wants to file workmans comp.

## 2019-02-04 NOTE — Discharge Instructions (Addendum)
Your exam and XR are consistent with a knee sprain. There is no evidence of  fracture or dislocation. Wear the ace bandage as needed for support. Apply ice and/or moist heat to reduce pain and swelling. Follow-up with Dr. Langley Gauss, or the provider approved by her company.

## 2019-02-04 NOTE — ED Notes (Signed)
Ace wrap applied to pt's left knee. Pt educated on [facility]- pt verbalizes understanding. CMS intact in LLE, pedal pulse 2+, cap refill <3 seconds.

## 2019-02-04 NOTE — ED Notes (Signed)
Pt states she slipped on ice at work and wants to file [facility] after hurting her left knee

## 2019-02-04 NOTE — ED Notes (Signed)
Spoke with supervisor at [employer] on [address] and she states they do need a urine drug screen for [facility]

## 2019-02-04 NOTE — ED Provider Notes (Signed)
[facility] Emergency Department Provider Note ____________________________________________  Time seen: 1143  I have reviewed the triage vital signs and the nursing notes.  HISTORY  Chief Complaint  Knee Pain (left)  HPI Mary Wall is a 29 y.o. female presents to the ED for evaluation of left knee pain and disability.  Patient describes a mechanical fall last night [employer].  She describes a slip and fall on the icy floor of the freezer.   She complains of pain to the medial aspect of the left knee secondary to the contusion.  She denies any history of chronic ongoing knee pain.  She reports pain to the touch as well as range of motion.  Past Medical History:  Diagnosis Date  . Anemia   . Depression   . Migraines   . Ovarian cyst     Patient Active Problem List   Diagnosis Date Noted  . Mass of right breast 10/22/2017  . Anemia 06/13/2014    History reviewed. No pertinent surgical history.  Prior to Admission medications   Medication Sig Start Date End Date Taking? Authorizing Provider  butalbital-acetaminophen-caffeine (FIORICET WITH CODEINE) 50-325-40-30 MG capsule Take by mouth.    [provider]  ibuprofen (ADVIL) 800 MG tablet Take 1 tablet (800 mg total) by mouth every 8 (eight) hours as needed. 02/04/19   Wisdom Seybold, Charlesetta Ivory, PA-C    Allergies Patient has no known allergies.  Family History  Problem Relation Age of Onset  . Cancer Maternal Grandfather     Social History Social History   Tobacco Use  . Smoking status: Current Every Day Smoker    Types: Cigarettes  . Smokeless tobacco: Never Used  Substance Use Topics  . Alcohol use: No  . Drug use: Yes    Types: Marijuana    Review of Systems  Constitutional: Negative for fever. Cardiovascular: Negative for chest pain. Respiratory: Negative for shortness of breath. Musculoskeletal: Negative for back pain.  Left knee pain as above. Skin: Negative for  rash. Neurological: Negative for headaches, focal weakness or numbness. ____________________________________________  PHYSICAL EXAM:  VITAL SIGNS: ED Triage Vitals  Enc Vitals Group     BP 02/04/19 1109 140/86     Pulse Rate 02/04/19 1109 77     Resp 02/04/19 1109 14     Temp 02/04/19 1109 98.4 F (36.9 C)     Temp Source 02/04/19 1109 Oral     SpO2 02/04/19 1109 100 %     Weight 02/04/19 1110 130 lb (59 kg)     Height 02/04/19 1110 5\' 1"  (1.549 m)     Head Circumference --      Peak Flow --      Pain Score 02/04/19 1110 7     Pain Loc --      Pain Edu? --      Excl. in GC? --     Constitutional: Alert and oriented. Well appearing and in no distress. Head: Normocephalic and atraumatic. Eyes: Conjunctivae are normal. Normal extraocular movements Cardiovascular: Normal rate, regular rhythm. Normal distal pulses. Respiratory: Normal respiratory effort.  Musculoskeletal: Nontender with normal range of motion in all extremities.  Left knee without obvious deformity or effusion.  Patient with normal active range of motion to the left knee.  She is tender to palpation to the left medial aspect of the proximal tibia.  No popliteal space fullness is noted.  Patient is also mildly tender to the medial joint with valgus stress.  No  anterior/posterior drawer sign is noted. Neurologic:  Normal gait without ataxia. Normal speech and language. No gross focal neurologic deficits are appreciated. Skin:  Skin is warm, dry and intact. No rash noted. ____________________________________________   RADIOLOGY  DG Left Knee IMPRESSION: Negative.  I, Melvenia Needles, personally viewed and evaluated these images (plain radiographs) as part of my medical decision making, as well as reviewing the written report by the radiologist. ____________________________________________  PROCEDURES  Ace bandage Procedures ____________________________________________  INITIAL IMPRESSION / ASSESSMENT  AND PLAN / ED COURSE  Patient with ED evaluation of left knee pain following mechanical fall.  Clinical picture consistent with a left knee contusion and sprain.  Likely to the MCL.  Patient is placed in Ace bandage for support.  No sign of internal derangement to the left knee.  A prescription for ibuprofen is provided for her benefit.  She will return to work after 1 day off.  She will follow-up with Dr. Mack Guise or the medical provider approved by her employer.  Mary Wall was evaluated in Emergency Department on 02/04/2019 for the symptoms described in the history of present illness. She was evaluated in the context of the global COVID-19 pandemic, which necessitated consideration that the patient might be at risk for infection with the SARS-CoV-2 virus that causes COVID-19. Institutional protocols and algorithms that pertain to the evaluation of patients at risk for COVID-19 are in a state of rapid change based on information released by regulatory bodies including the CDC and federal and state organizations. These policies and algorithms were followed during the patient's care in the ED. ____________________________________________  FINAL CLINICAL IMPRESSION(S) / ED DIAGNOSES  Final diagnoses:  Sprain of medial collateral ligament of left knee, initial encounter      Melvenia Needles, PA-C 02/04/19 1917    Lavonia Drafts, MD 02/05/19 [id number]

## 2019-02-07 DIAGNOSIS — S8392XA Sprain of unspecified site of left knee, initial encounter: Secondary | ICD-10-CM | POA: Diagnosis not present

## 2019-02-07 DIAGNOSIS — M25562 Pain in left knee: Secondary | ICD-10-CM | POA: Diagnosis not present

## 2019-06-23 ENCOUNTER — Ambulatory Visit: Payer: BC Managed Care – PPO | Admitting: Adult Health

## 2019-06-23 ENCOUNTER — Other Ambulatory Visit: Payer: Self-pay

## 2019-06-23 ENCOUNTER — Encounter: Payer: Self-pay | Admitting: Adult Health

## 2019-06-23 VITALS — BP 140/90 | HR 89 | Temp 98.2°F | Resp 15 | Ht 61.0 in | Wt 132.8 lb

## 2019-06-23 DIAGNOSIS — Z13220 Encounter for screening for lipoid disorders: Secondary | ICD-10-CM | POA: Diagnosis not present

## 2019-06-23 DIAGNOSIS — R110 Nausea: Secondary | ICD-10-CM | POA: Diagnosis not present

## 2019-06-23 DIAGNOSIS — R319 Hematuria, unspecified: Secondary | ICD-10-CM

## 2019-06-23 DIAGNOSIS — Z0000 Encounter for general adult medical examination without abnormal findings: Secondary | ICD-10-CM

## 2019-06-23 DIAGNOSIS — Z3040 Encounter for surveillance of contraceptives, unspecified: Secondary | ICD-10-CM

## 2019-06-23 DIAGNOSIS — E559 Vitamin D deficiency, unspecified: Secondary | ICD-10-CM

## 2019-06-23 DIAGNOSIS — D649 Anemia, unspecified: Secondary | ICD-10-CM

## 2019-06-23 DIAGNOSIS — Z1389 Encounter for screening for other disorder: Secondary | ICD-10-CM | POA: Diagnosis not present

## 2019-06-23 DIAGNOSIS — R1012 Left upper quadrant pain: Secondary | ICD-10-CM

## 2019-06-23 DIAGNOSIS — K219 Gastro-esophageal reflux disease without esophagitis: Secondary | ICD-10-CM

## 2019-06-23 DIAGNOSIS — N6310 Unspecified lump in the right breast, unspecified quadrant: Secondary | ICD-10-CM

## 2019-06-23 DIAGNOSIS — E663 Overweight: Secondary | ICD-10-CM

## 2019-06-23 HISTORY — DX: Gastro-esophageal reflux disease without esophagitis: K21.9

## 2019-06-23 LAB — POCT URINE PREGNANCY: Preg Test, Ur: NEGATIVE

## 2019-06-23 LAB — POCT URINALYSIS DIPSTICK
Glucose, UA: NEGATIVE
Ketones, UA: NEGATIVE
Leukocytes, UA: NEGATIVE
Nitrite, UA: NEGATIVE
Protein, UA: POSITIVE — AB
Spec Grav, UA: 1.02 (ref 1.010–1.025)
Urobilinogen, UA: 1 E.U./dL
pH, UA: 6.5 (ref 5.0–8.0)

## 2019-06-23 MED ORDER — PANTOPRAZOLE SODIUM 40 MG PO TBEC: 40.0000 mg | DELAYED_RELEASE_TABLET | Freq: Every day | ORAL | 0 refills | Status: DC

## 2019-06-23 MED ORDER — ONDANSETRON HCL 4 MG PO TABS: 4.0000 mg | ORAL_TABLET | Freq: Three times a day (TID) | ORAL | 0 refills | Status: DC | PRN

## 2019-06-23 NOTE — Progress Notes (Signed)
New patient visit   Patient: Mary Wall   DOB: 22-Feb-1990   29 y.o. Female  MRN: [id number] Visit Date: 06/23/2019  Today's healthcare provider: Marcille Buffy, FNP   Clint Bolder as a scribe for [place], FNP.,have documented all relevant documentation on the behalf of [place] Enedelia Martorelli, FNP,as directed by  Marcille Buffy, FNP while in the presence of Marcille Buffy, [place]. Chief Complaint  Patient presents with   New Patient (Initial Visit)   Subjective    Mary Wall is a 29 y.o. female who presents today as a new patient to establish care.  HPI  Patient reports that she feels well today, she is following a well balanced diet, she reports she is not actively exercising and states that sleep is poor on average patient sleeps 3-4hrs a night, she states that she works 2nd shift so she has difficulty falling asleep. Patient states that she would like to discuss today getting Nexplanon removed. Patient reports that she had nexplanon placed in her left arm about 5 years ago and states that since she has been on medication it has caused more of a heavy menstrual flow and patient reports have a cycle twice a month.  Patient reports that she has noticed a lump present in right breast that has been present in 2019.  Patient would also like to address sensitivity to dairy products, patient reports that when she eats dairy she has moderate  abdominal pain with milk/ dairy products only and has diarrhea and vomiting, patient denies blood in her bowel movements and symptoms. Denies any other allergic symptoms. Reports she does well if she avoids dairy but she loves it so she still eats it despite symptoms. Denies any worsening or symptoms with other foods such as spicey or fatty fried foods.  Patient  denies any fever, body aches,chills, rash, chest pain, shortness of breath, nausea, vomiting, or diarrhea.   Has seen Dr. Bary Castilla  for this in past was told not cancerous by biopsy. She has felt this area may have enlarged. She reports tingling in the area with timing of her menstrual cycle.  10/21/17 US Breast Complete Uni Right Ultrasound examination of the right breast in the area of palpable abnormality, 12 o'clock position, 3 cm from the nipple showed a complex cystic and solid lesion with areas of posterior acoustic enhancement as well as focal solid areas measuring up to 0.8 cm with faint posterior shadowing.  The mass is not well-defined.  This does not appear to extend below the pectoralis muscle.  Does not quite reach the deep layer of the dermis.  No increased vascular flow is noted.  BI-RADS-4  Patient  denies any fever, body aches,chills, rash, chest pain, shortness of breath, nausea, vomiting, or diarrhea.  Denies dizziness, lightheadedness, pre syncopal or syncopal episodes.    Past Medical History:  Diagnosis Date   Anemia    Blood transfusion without reported diagnosis    Depression    Migraines    Ovarian cyst    History reviewed. No pertinent surgical history. Family Status  Relation Name Status   MGF  (Not Specified)   Mother  (Not Specified)   Family History  Problem Relation Age of Onset   Cancer Maternal Grandfather    Hypertension Mother    Social History   Socioeconomic History   Marital status: Single    Spouse name: Not on file   Number of children: 2  Years of education: Not on file   Highest education level: Not on file  Occupational History   Not on file  Tobacco Use   Smoking status: Former Smoker    Types: Cigarettes   Smokeless tobacco: Never Used  Building services engineer Use: Every day  Substance and Sexual Activity   Alcohol use: No   Drug use: Yes    Types: Marijuana   Sexual activity: Not on file  Other Topics Concern   Not on file  Social History Narrative   Not on file   Social Determinants of Health   Financial Resource Strain:     Difficulty of Paying Living Expenses:   Food Insecurity:    Worried About Programme researcher, broadcasting/film/video in the Last Year:    Barista in the Last Year:   Transportation Needs:    Freight forwarder (Medical):    Lack of Transportation (Non-Medical):   Physical Activity:    Days of Exercise per Week:    Minutes of Exercise per Session:   Stress:    Feeling of Stress :   Social Connections:    Frequency of Communication with Friends and Family:    Frequency of Social Gatherings with Friends and Family:    Attends Religious Services:    Active Member of Clubs or Organizations:    Attends Banker Meetings:    Marital Status:    Outpatient Medications Prior to Visit  Medication Sig   butalbital-acetaminophen-caffeine (FIORICET WITH CODEINE) 50-325-40-30 MG capsule Take by mouth.   [DISCONTINUED] ibuprofen (ADVIL) 800 MG tablet Take 1 tablet (800 mg total) by mouth every 8 (eight) hours as needed.   No facility-administered medications prior to visit.   No Known Allergies   There is no immunization history on file for this patient.  Health Maintenance  Topic Date Due   Hepatitis C Screening  Never done   COVID-19 Vaccine (1) Never done   HIV Screening  Never done   TETANUS/TDAP  Never done   PAP SMEAR-Modifier  Never done   INFLUENZA VACCINE  08/06/2019    Patient Care Team: Berniece Pap, FNP as PCP - General (Family Medicine)  Review of Systems  Cardiovascular: Negative.   Gastrointestinal: Positive for diarrhea, nausea and vomiting.  Genitourinary: Positive for menstrual problem.  Musculoskeletal: Negative.   Neurological: Positive for headaches.  All other systems reviewed and are negative.   Last CBC Lab Results  Component Value Date   WBC 6.5 10/23/2015   HGB 14.5 10/23/2015   HCT 44.4 10/23/2015   MCV 85.4 10/23/2015   MCH 27.8 10/23/2015   RDW 13.3 10/23/2015   PLT 236 10/23/2015   Last metabolic panel Lab  Results  Component Value Date   GLUCOSE 109 (H) 08/22/2013   NA 138 08/22/2013   K 3.5 08/22/2013   CL 104 08/22/2013   CO2 23 08/22/2013   BUN 8 08/22/2013   CREATININE 0.86 08/22/2013   GFRNONAA >60 08/22/2013   GFRAA >60 08/22/2013   CALCIUM 8.9 08/22/2013   PROT 7.7 08/22/2013   ALBUMIN 3.2 (L) 08/22/2013   BILITOT 0.2 08/22/2013   ALKPHOS 73 08/22/2013   AST 24 08/22/2013   ALT 27 08/22/2013   ANIONGAP 11 08/22/2013      Objective    BP 140/90    Pulse 89    Temp 98.2 F (36.8 C) (Oral)    Resp 15    Ht 5\' 1"  (1.549 m)  Wt 132 lb 12.8 oz (60.2 kg)    LMP  (Within Weeks)    SpO2 98%    BMI 25.09 kg/m  Physical Exam Vitals reviewed.  Constitutional:      Appearance: Normal appearance. She is normal weight.     Comments: Patient is alert and oriented and responsive to questions Engages in eye contact with provider. Speaks in full sentences without any pauses without any shortness of breath or distress.    HENT:     Head: Normocephalic and atraumatic.     Right Ear: External ear normal. There is impacted cerumen.     Left Ear: External ear normal. There is impacted cerumen.     Ears:     Comments: cerumen impaction mild in both ears.     Nose: Nose normal. No congestion or rhinorrhea.     Mouth/Throat:     Mouth: Mucous membranes are moist.     Pharynx: No oropharyngeal exudate or posterior oropharyngeal erythema.  Eyes:     General: No scleral icterus.       Right eye: No discharge.        Left eye: No discharge.     Extraocular Movements: Extraocular movements intact.     Conjunctiva/sclera: Conjunctivae normal.     Pupils: Pupils are equal, round, and reactive to light.  Cardiovascular:     Rate and Rhythm: Normal rate and regular rhythm.     Pulses: Normal pulses.     Heart sounds: Normal heart sounds.  Pulmonary:     Effort: Pulmonary effort is normal. No respiratory distress.     Breath sounds: Normal breath sounds. No stridor. No wheezing, rhonchi or  rales.  Chest:     Chest wall: No tenderness.     Breasts: Tanner Score is 5.        Right: Mass present. No inverted nipple, nipple discharge, skin change or tenderness.        Left: Normal. No inverted nipple, nipple discharge, skin change or tenderness.     Comments: 3cm x 3 firm non mobile mass, right breast 3'oclock Abdominal:     General: Bowel sounds are normal. There is no distension.     Palpations: Abdomen is soft.     Tenderness: There is abdominal tenderness in the epigastric area. There is no right CVA tenderness, left CVA tenderness, guarding or rebound. Negative signs include Murphy's sign, Rovsing's sign, McBurney's sign, psoas sign and obturator sign.       Comments: LUQ and epigastric mild tenderness.   Genitourinary:    Vagina: Normal.     Cervix: Normal.     Uterus: Normal.      Adnexa: Right adnexa normal.     Rectum: No external hemorrhoid.  Musculoskeletal:        General: Normal range of motion.     Cervical back: Normal range of motion and neck supple.  Lymphadenopathy:     Upper Body:     Right upper body: No supraclavicular, axillary or pectoral adenopathy.     Left upper body: No supraclavicular, axillary or pectoral adenopathy.  Skin:    General: Skin is dry.     Capillary Refill: Capillary refill takes less than 2 seconds.  Neurological:     General: No focal deficit present.     Mental Status: She is alert and oriented to person, place, and time.     Motor: No weakness.     Gait: Gait normal.  Psychiatric:  Attention and Perception: Attention normal.        Mood and Affect: Mood normal.        Speech: Speech normal.        Behavior: Behavior normal. Behavior is cooperative.        Thought Content: Thought content normal.        Cognition and Memory: Cognition normal.        Judgment: Judgment normal.      Depression Screen PHQ 2/9 Scores 06/23/2019  PHQ - 2 Score 4  PHQ- 9 Score 11   Results for orders placed or performed in  visit on 06/23/19  POCT urinalysis dipstick  Result Value Ref Range   Color, UA yellow    Clarity, UA clear    Glucose, UA Negative Negative   Bilirubin, UA small    Ketones, UA negative    Spec Grav, UA 1.020 1.010 - 1.025   Blood, UA large    pH, UA 6.5 5.0 - 8.0   Protein, UA Positive (A) Negative   Urobilinogen, UA 1.0 0.2 or 1.0 E.U./dL   Nitrite, UA negative    Leukocytes, UA Negative Negative   Appearance     Odor    POCT urine pregnancy  Result Value Ref Range   Preg Test, Ur Negative Negative    Assessment & Plan     1. Screening for blood or protein in urine  - POCT urinalysis dipstick  2. Vitamin D deficiency  - VITAMIN D 25 Hydroxy (Vit-D Deficiency, Fractures)  3. Screening for lipid disorders  - Lipid panel - Comprehensive metabolic panel  4. Mass of right breast - US BREAST LTD UNI RIGHT INC AXILLA; Future - MM Digital Diagnostic Bilat; Future - US BREAST LTD UNI LEFT INC AXILLA; Future She should hear for above imaging within two weeks and call if not to office.  5. Anemia, unspecified type  - CBC with Differential/Platelet   6. Encounter for surveillance of contraceptives, unspecified contraceptive She wants gynecology and Nexplanon removal with a different birth control. Discussed  - Ambulatory referral to Gynecology  7. Gastroesophageal reflux disease without esophagitis  - pantoprazole (PROTONIX) 40 MG tablet; Take 1 tablet (40 mg total) by mouth daily.  Dispense: 90 tablet; Refill: 0  8. Left upper quadrant abdominal tenderness with palpation only  Suspect recent viral gastroenteritis she has been nauseated and had vomiting a few times two days ago. Onset was 3 days ago. Emergent RED flags discussed.   Zofran 4 mg every  8 hours only if needed for nausea. Negative POCT HCG. Denies any pregnancy.   - pantoprazole (PROTONIX) 40 MG tablet; Take 1 tablet (40 mg total) by mouth daily.  Dispense: 90 tablet; Refill: 0   Should hear from  gynecology within 2 weeks and call if not.   Advised patient call the office or your primary care doctor for an appointment if no improvement within 72 hours or if any symptoms change or worsen at any time  Advised ER or urgent Care if after hours or on weekend. Call 911 for emergency symptoms at any time.Patinet verbalized understanding of all instructions given/reviewed and treatment plan and has no further questions or concerns at this time.      IBeverely Pace Bryden Darden, FNP, have reviewed all documentation for this visit. The documentation on 06/23/19 for the exam, diagnosis, procedures, and orders are all accurate and complete.   Jairo Ben, FNP  [facility] [phone] (phone) [phone] (fax)  [place]

## 2019-06-23 NOTE — Patient Instructions (Addendum)
Keep diet log with documented stomach symptoms  Health Maintenance, Female Adopting a healthy lifestyle and getting preventive care are important in promoting health and wellness. Ask your health care provider about:  The right schedule for you to have regular tests and exams.  Things you can do on your own to prevent diseases and keep yourself healthy. What should I know about diet, weight, and exercise? Eat a healthy diet   Eat a diet that includes plenty of vegetables, fruits, low-fat dairy products, and lean protein.  Do not eat a lot of foods that are high in solid fats, added sugars, or sodium. Maintain a healthy weight Body mass index (BMI) is used to identify weight problems. It estimates body fat based on height and weight. Your health care provider can help determine your BMI and help you achieve or maintain a healthy weight. Get regular exercise Get regular exercise. This is one of the most important things you can do for your health. Most adults should:  Exercise for at least 150 minutes each week. The exercise should increase your heart rate and make you sweat (moderate-intensity exercise).  Do strengthening exercises at least twice a week. This is in addition to the moderate-intensity exercise.  Spend less time sitting. Even light physical activity can be beneficial. Watch cholesterol and blood lipids Have your blood tested for lipids and cholesterol at 31 years of age, then have this test every 5 years. Have your cholesterol levels checked more often if:  Your lipid or cholesterol levels are high.  You are older than 31 years of age.  You are at high risk for heart disease. What should I know about cancer screening? Depending on your health history and family history, you may need to have cancer screening at various ages. This may include screening for:  Breast cancer.  Cervical cancer.  Colorectal cancer.  Skin cancer.  Lung cancer. What should I know  about heart disease, diabetes, and high blood pressure? Blood pressure and heart disease  High blood pressure causes heart disease and increases the risk of stroke. This is more likely to develop in people who have high blood pressure readings, are of African descent, or are overweight.  Have your blood pressure checked: ? Every 3-5 years if you are 31-65 years of age. ? Every year if you are 31 years old or older. Diabetes Have regular diabetes screenings. This checks your fasting blood sugar level. Have the screening done:  Once every three years after age 31 if you are at 31 years of age and have a low risk for diabetes.  More often and at a younger age if you are overweight or have a high risk for diabetes. What should I know about preventing infection? Hepatitis B If you have a higher risk for hepatitis B, you should be screened for this virus. Talk with your health care provider to find out if you are at risk for hepatitis B infection. Hepatitis C Testing is recommended for:  Everyone born from [birth year] through 1967.  Anyone with known risk factors for hepatitis C. Sexually transmitted infections (STIs)  Get screened for STIs, including gonorrhea and chlamydia, if: ? You are sexually active and are younger than 31 years of age. ? You are older than 31 years of age and your health care provider tells you that you are at risk for this type of infection. ? Your sexual activity has changed since you were last screened, and you are at increased risk for chlamydia  or gonorrhea. Ask your health care provider if you are at risk.  Ask your health care provider about whether you are at high risk for HIV. Your health care provider may recommend a prescription medicine to help prevent HIV infection. If you choose to take medicine to prevent HIV, you should first get tested for HIV. You should then be tested every 3 months for as long as you are taking the medicine. Pregnancy  If you are about  to stop having your period (premenopausal) and you may become pregnant, seek counseling before you get pregnant.  Take 400 to 800 micrograms (mcg) of folic acid every day if you become pregnant.  Ask for birth control (contraception) if you want to prevent pregnancy. Osteoporosis and menopause Osteoporosis is a disease in which the bones lose minerals and strength with aging. This can result in bone fractures. If you are 60 years old or older, or if you are at risk for osteoporosis and fractures, ask your health care provider if you should:  Be screened for bone loss.  Take a calcium or vitamin D supplement to lower your risk of fractures.  Be given hormone replacement therapy (HRT) to treat symptoms of menopause. Follow these instructions at home: Lifestyle  Do not use any products that contain nicotine or tobacco, such as cigarettes, e-cigarettes, and chewing tobacco. If you need help quitting, ask your health care provider.  Do not use street drugs.  Do not share needles.  Ask your health care provider for help if you need support or information about quitting drugs. Alcohol use  Do not drink alcohol if: ? Your health care provider tells you not to drink. ? You are pregnant, may be pregnant, or are planning to become pregnant.  If you drink alcohol: ? Limit how much you use to 0-1 drink a day. ? Limit intake if you are breastfeeding.  Be aware of how much alcohol is in your drink. In the [place] one drink equals one 12 oz bottle of beer (355 mL), one 5 oz glass of wine (148 mL), or one 1 oz glass of hard liquor (44 mL). General instructions  Schedule regular health, dental, and eye exams.  Stay current with your vaccines.  Tell your health care provider if: ? You often feel depressed. ? You have ever been abused or do not feel safe at home. Summary  Adopting a healthy lifestyle and getting preventive care are important in promoting health and wellness.  Follow your  health care provider's instructions about healthy diet, exercising, and getting tested or screened for diseases.  Follow your health care provider's instructions on monitoring your cholesterol and blood pressure. This information is not intended to replace advice given to you by your health care provider. Make sure you discuss any questions you have with your health care provider. Document Revised: 12/15/2017 Document Reviewed: 12/15/2017 Elsevier Patient Education  2020 [employer] Lactose-Free Diet, Adult If you have lactose intolerance, you are not able to digest lactose. Lactose is a natural sugar found mainly in dairy milk and dairy products. You may need to avoid all foods and beverages that contain lactose. A lactose-free diet can help you do this. Which foods have lactose? Lactose is found in dairy milk and dairy products, such as:  Yogurt.  Cheese.  Butter.  Margarine.  Sour cream.  Cream.  Whipped toppings and nondairy creamers.  Ice cream and other dairy-based desserts. Lactose is also found in foods or products made with dairy milk  or milk ingredients. To find out whether a food contains dairy milk or a milk ingredient, look at the ingredients list. Avoid foods with the statement "May contain milk" and foods that contain:  Milk powder.  Whey.  Curd.  Caseinate.  Lactose.  Lactalbumin.  Lactoglobulin. What are alternatives to dairy milk and foods made with milk products?  Lactose-free milk.  Soy milk with added calcium and vitamin D.  Almond milk, coconut milk, rice milk, or other nondairy milk alternatives with added calcium and vitamin D. Note that these are low in protein.  Soy products, such as soy yogurt, soy cheese, soy ice cream, and soy-based sour cream.  Other nut milk products, such as almond yogurt, almond cheese, cashew yogurt, cashew cheese, cashew ice cream, coconut yogurt, and coconut ice cream. What are tips for following this  plan?  Do not consume foods, beverages, vitamins, minerals, or medicines containing lactose. Read ingredient lists carefully.  Look for the words "lactose-free" on labels.  Use lactase enzyme drops or tablets as directed by your health care provider.  Use lactose-free milk or a milk alternative, such as soy milk or almond milk, for drinking and cooking.  Make sure you get enough calcium and vitamin D in your diet. A lactose-free eating plan can be lacking in these important nutrients.  Take calcium and vitamin D supplements as directed by your health care provider. Talk to your health care provider about supplements if you are not able to get enough calcium and vitamin D from food. What foods can I eat?  Fruits All fresh, canned, frozen, or dried fruits that are not processed with lactose. Vegetables All fresh, frozen, and canned vegetables without cheese, cream, or butter sauces. Grains Any that are not made with dairy milk or dairy products. Meats and other proteins Any meat, fish, poultry, and other protein sources that are not made with dairy milk or dairy products. Soy cheese and yogurt. Fats and oils Any that are not made with dairy milk or dairy products. Beverages Lactose-free milk. Soy, rice, or almond milk with added calcium and vitamin D. Fruit and vegetable juices. Sweets and desserts Any that are not made with dairy milk or dairy products. Seasonings and condiments Any that are not made with dairy milk or dairy products. Calcium Calcium is found in many foods that contain lactose and is important for bone health. The amount of calcium you need depends on your age:  Adults younger than 50 years: 1,000 mg of calcium a day.  Adults older than 50 years: 1,200 mg of calcium a day. If you are not getting enough calcium, you may get it from other sources, including:  Orange juice with calcium added. There are 300-350 mg of calcium in 1 cup of orange  juice.  Calcium-fortified soy milk. There are 300-400 mg of calcium in 1 cup of calcium-fortified soy milk.  Calcium-fortified rice or almond milk. There are 300 mg of calcium in 1 cup of calcium-fortified rice or almond milk.  Calcium-fortified breakfast cereals. There are 100-1,000 mg of calcium in calcium-fortified breakfast cereals.  Spinach, cooked. There are 145 mg of calcium in  cup of cooked spinach.  Edamame, cooked. There are 130 mg of calcium in  cup of cooked edamame.  Collard greens, cooked. There are 125 mg of calcium in  cup of cooked collard greens.  Kale, frozen or cooked. There are 90 mg of calcium in  cup of cooked or frozen kale.  Almonds. There are 95  mg of calcium in  cup of almonds.  Broccoli, cooked. There are 60 mg of calcium in 1 cup of cooked broccoli. The items listed above may not be a complete list of recommended foods and beverages. Contact a dietitian for more options. What foods are not recommended? Fruits None, unless they are made with dairy milk or dairy products. Vegetables None, unless they are made with dairy milk or dairy products. Grains Any grains that are made with dairy milk or dairy products. Meats and other proteins None, unless they are made with dairy milk or dairy products. Dairy All dairy products, including milk, goat's milk, buttermilk, kefir, acidophilus milk, flavored milk, evaporated milk, condensed milk, dulce de [place], eggnog, yogurt, cheese, and cheese spreads. Fats and oils Any that are made with milk or milk products. Margarines and salad dressings that contain milk or cheese. Cream. Half and half. Cream cheese. Sour cream. Chip dips made with sour cream or yogurt. Beverages Hot chocolate. Cocoa with lactose. Instant iced teas. Powdered fruit drinks. Smoothies made with dairy milk or yogurt. Sweets and desserts Any that are made with milk or milk products. Seasonings and condiments Chewing gum that has lactose.  Spice blends if they contain lactose. Artificial sweeteners that contain lactose. Nondairy creamers. The items listed above may not be a complete list of foods and beverages to avoid. Contact a dietitian for more information. Summary  If you are lactose intolerant, it means that you have a hard time digesting lactose, a natural sugar found in milk and milk products.  Following a lactose-free diet can help you manage this condition.  Calcium is important for bone health and is found in many foods that contain lactose. Talk with your health care provider about other sources of calcium. This information is not intended to replace advice given to you by your health care provider. Make sure you discuss any questions you have with your health care provider. Document Revised: 01/19/2017 Document Reviewed: 01/19/2017 Elsevier Patient Education  2020 [employer].  Food Choices for Gastroesophageal Reflux Disease, Adult When you have gastroesophageal reflux disease (GERD), the foods you eat and your eating habits are very important. Choosing the right foods can help ease your discomfort. Think about working with a nutrition specialist (dietitian) to help you make good choices. What are tips for following this plan?  Meals  Choose healthy foods that are low in fat, such as fruits, vegetables, whole grains, low-fat dairy products, and lean meat, fish, and poultry.  Eat small meals often instead of 3 large meals a day. Eat your meals slowly, and in a place where you are relaxed. Avoid bending over or lying down until 2-3 hours after eating.  Avoid eating meals 2-3 hours before bed.  Avoid drinking a lot of liquid with meals.  Cook foods using methods other than frying. Bake, grill, or broil food instead.  Avoid or limit: ? Chocolate. ? Peppermint or spearmint. ? Alcohol. ? Pepper. ? Black and decaffeinated coffee. ? Black and decaffeinated tea. ? Bubbly (carbonated) soft  drinks. ? Caffeinated energy drinks and soft drinks.  Limit high-fat foods such as: ? Fatty meat or fried foods. ? Whole milk, cream, butter, or ice cream. ? Nuts and nut butters. ? Pastries, donuts, and sweets made with butter or shortening.  Avoid foods that cause symptoms. These foods may be different for everyone. Common foods that cause symptoms include: ? Tomatoes. ? Oranges, lemons, and limes. ? Peppers. ? Spicy food. ? Onions and garlic. ?  Vinegar. Lifestyle  Maintain a healthy weight. Ask your doctor what weight is healthy for you. If you need to lose weight, work with your doctor to do so safely.  Exercise for at least 30 minutes for 5 or more days each week, or as told by your doctor.  Wear loose-fitting clothes.  Do not smoke. If you need help quitting, ask your doctor.  Sleep with the head of your bed higher than your feet. Use a wedge under the mattress or blocks under the bed frame to raise the head of the bed. Summary  When you have gastroesophageal reflux disease (GERD), food and lifestyle choices are very important in easing your symptoms.  Eat small meals often instead of 3 large meals a day. Eat your meals slowly, and in a place where you are relaxed.  Limit high-fat foods such as fatty meat or fried foods.  Avoid bending over or lying down until 2-3 hours after eating.  Avoid peppermint and spearmint, caffeine, alcohol, and chocolate. This information is not intended to replace advice given to you by your health care provider. Make sure you discuss any questions you have with your health care provider. Document Revised: 04/14/2018 Document Reviewed: 01/28/2016 Elsevier Patient Education  2020 Elsevier Inc.  Abdominal Pain, Adult Many things can cause belly (abdominal) pain. Most times, belly pain is not dangerous. Many cases of belly pain can be watched and treated at home. Sometimes, though, belly pain is serious. Your doctor will try to find the  cause of your belly pain. Follow these instructions at home:  Medicines  Take over-the-counter and prescription medicines only as told by your doctor.  Do not take medicines that help you poop (laxatives) unless told by your doctor. General instructions  Watch your belly pain for any changes.  Drink enough fluid to keep your pee (urine) pale yellow.  Keep all follow-up visits as told by your doctor. This is important. Contact a doctor if:  Your belly pain changes or gets worse.  You are not hungry, or you lose weight without trying.  You are having trouble pooping (constipated) or have watery poop (diarrhea) for more than 2-3 days.  You have pain when you pee or poop.  Your belly pain wakes you up at night.  Your pain gets worse with meals, after eating, or with certain foods.  You are vomiting and cannot keep anything down.  You have a fever.  You have blood in your pee. Get help right away if:  Your pain does not go away as soon as your doctor says it should.  You cannot stop vomiting.  Your pain is only in areas of your belly, such as the right side or the left lower part of the belly.  You have bloody or black poop, or poop that looks like tar.  You have very bad pain, cramping, or bloating in your belly.  You have signs of not having enough fluid or water in your body (dehydration), such as: ? Dark pee, very little pee, or no pee. ? Cracked lips. ? Dry mouth. ? Sunken eyes. ? Sleepiness. ? Weakness.  You have trouble breathing or chest pain. Summary  Many cases of belly pain can be watched and treated at home.  Watch your belly pain for any changes.  Take over-the-counter and prescription medicines only as told by your doctor.  Contact a doctor if your belly pain changes or gets worse.  Get help right away if you have very bad  pain, cramping, or bloating in your belly. This information is not intended to replace advice given to you by your health  care provider. Make sure you discuss any questions you have with your health care provider. Document Revised: 05/02/2018 Document Reviewed: 05/02/2018 Elsevier Patient Education  2020 Elsevier Inc.  Lactose Intolerance, Adult Lactose is a natural sugar that is found in dairy milk and dairy products such as cheese and yogurt. Lactose is digested by lactase, a protein (enzyme) in your small intestine. Some people do not produce enough lactase to digest lactose. This is called lactose intolerance. Lactose intolerance is different from milk allergy, which is a more serious reaction to the protein in milk. What are the causes? Causes of lactose intolerance may include:  Normal aging. The ability to produce lactase may lessen with age, causing lactose intolerance over time.  Being born without the ability to make lactase.  Digestive diseases such as gastroenteritis or inflammatory bowel disease (IBD).  Surgery or injury to your small intestine.  Infection in your intestines.  Certain antibiotic medicines and cancer treatments. What are the signs or symptoms? Lactose intolerance can cause discomfort within 30 minutes to 2 hours after you eat or drink something that contains lactose. Symptoms may include:  Nausea.  Diarrhea.  Cramps or pain in the abdomen.  A full, tight, or painful feeling in the abdomen (bloating).  Gas. How is this diagnosed? This condition may be diagnosed based on:  Your symptoms and medical history.  Lactose tolerance test. This test involves drinking a lactose solution and then having blood tests to measure the amount of glucose in your blood. If your blood glucose level does not go up, it means your body is not able to digest the lactose.  Lactose breath test (hydrogen breath test). This test involves drinking a lactose solution and then exhaling into a type of bag while you digest the solution. Having a lot of hydrogen in your breath can be a sign of lactose  intolerance.  Stool acidity test. This involves drinking a lactose solution and then having your stool samples tested for bacteria. Having a lot of bacteria causes stool to be considered acidic, which is a sign of lactose intolerance. How is this treated? There is no treatment to improve your body's ability to produce lactase. However, you can manage your symptoms at home by:  Limiting or avoiding dairy milk, dairy products, and other sources of lactose.  Taking lactase tablets when you eat milk products. Lactase tablets are over-the-counter medicines that help to improve lactose digestion. You may also add lactase drops to regular milk.  Adjusting your diet, such as drinking lactose-free milk. Lactose tolerance varies from person to person. Some people may be able to eat or drink small amounts of products that contain lactose, and other people may need to avoid all foods and drinks that contain lactose. Talk with your health care provider about what treatment is best for you. Follow these instructions at home:  Limit or avoid foods, beverages, and medicines that contain lactose, as told by your health care provider. Keep track of which foods, beverages, or medicines cause symptoms so you can decide what to avoid in the future.  Read food and medicine labels carefully. Avoid products that contain: ? Lactose. ? Milk solids. ? Casein. ? Whey.  Take over-the-counter and prescription medicines (including lactase tablets) only as told by your health care provider.  If you stop eating and drinking dairy products (eliminate dairy from your diet),  make sure to get enough protein, calcium, and vitamin D from other foods. Work with your health care provider or a diet and nutrition specialist (dietitian) to make sure you get enough of those nutrients.  Choose a milk substitute that is fortified with calcium and vitamin D. ? Soy milk contains high-quality protein. ? Milks that are made from nuts or  grains (such as almond milk and rice milk) contain very small amounts of protein.  Keep all follow-up visits as told by your health care provider. This is important. Contact a health care provider if:  You have no relief from your symptoms after you have eliminated milk products and other sources of lactose. Get help right away if:  You have blood in your stool.  You have severe abdomen (abdominal) pain. Summary  Lactose is a natural sugar that is found in dairy milk and dairy products such as cheese and yogurt. Lactose is digested by lactase, which is a protein (enzyme) in the small intestine.  Some people do not produce enough lactase to digest lactose. This is called lactose intolerance.  Lactose intolerance can cause discomfort within 30 minutes to 2 hours after you eat or drink something that contains lactose.  Limit or avoid foods, beverages, and medicines that contain lactose, as told by your health care provider. This information is not intended to replace advice given to you by your health care provider. Make sure you discuss any questions you have with your health care provider. Document Revised: 12/04/2016 Document Reviewed: 08/07/2016 Elsevier Patient Education  2020 [employer].

## 2019-06-25 LAB — URINE CULTURE

## 2019-06-26 ENCOUNTER — Telehealth: Payer: Self-pay | Admitting: Adult Health

## 2019-06-26 DIAGNOSIS — N6310 Unspecified lump in the right breast, unspecified quadrant: Secondary | ICD-10-CM

## 2019-06-26 DIAGNOSIS — E559 Vitamin D deficiency, unspecified: Secondary | ICD-10-CM | POA: Diagnosis not present

## 2019-06-26 DIAGNOSIS — Z13220 Encounter for screening for lipoid disorders: Secondary | ICD-10-CM | POA: Diagnosis not present

## 2019-06-26 DIAGNOSIS — D649 Anemia, unspecified: Secondary | ICD-10-CM | POA: Diagnosis not present

## 2019-06-26 NOTE — Telephone Encounter (Signed)
Per Delford Field they will need an order for diagnostic bilateral mammogram TOMO [id number] before appointment can be scheduled,Thanks

## 2019-06-26 NOTE — Telephone Encounter (Signed)
Ordered IMG [phone]

## 2019-06-26 NOTE — Telephone Encounter (Signed)
OK, to order.

## 2019-06-27 LAB — CBC WITH DIFFERENTIAL/PLATELET
Basophils Absolute: 0.1 10*3/uL (ref 0.0–0.2)
Basos: 1 %
EOS (ABSOLUTE): 0.1 10*3/uL (ref 0.0–0.4)
Eos: 2 %
Hematocrit: 46.2 % (ref 34.0–46.6)
Hemoglobin: 14.7 g/dL (ref 11.1–15.9)
Immature Grans (Abs): 0 10*3/uL (ref 0.0–0.1)
Immature Granulocytes: 0 %
Lymphocytes Absolute: 1.7 10*3/uL (ref 0.7–3.1)
Lymphs: 42 %
MCH: 29.1 pg (ref 26.6–33.0)
MCHC: 31.8 g/dL (ref 31.5–35.7)
MCV: 92 fL (ref 79–97)
Monocytes Absolute: 0.3 10*3/uL (ref 0.1–0.9)
Monocytes: 6 %
Neutrophils Absolute: 2 10*3/uL (ref 1.4–7.0)
Neutrophils: 49 %
Platelets: 208 10*3/uL (ref 150–450)
RBC: 5.05 x10E6/uL (ref 3.77–5.28)
RDW: 13.2 % (ref 11.7–15.4)
WBC: 4.1 10*3/uL (ref 3.4–10.8)

## 2019-06-27 LAB — COMPREHENSIVE METABOLIC PANEL
ALT: 10 IU/L (ref 0–32)
AST: 16 IU/L (ref 0–40)
Albumin/Globulin Ratio: 1.5 (ref 1.2–2.2)
Albumin: 4.2 g/dL (ref 3.9–5.0)
Alkaline Phosphatase: 62 IU/L (ref 48–121)
BUN/Creatinine Ratio: 15 (ref 9–23)
BUN: 13 mg/dL (ref 6–20)
Bilirubin Total: <0.2 mg/dL (ref 0.0–1.2)
CO2: 21 mmol/L (ref 20–29)
Calcium: 8.8 mg/dL (ref 8.7–10.2)
Chloride: 104 mmol/L (ref 96–106)
Creatinine, Ser: 0.88 mg/dL (ref 0.57–1.00)
GFR calc Af Amer: 102 mL/min/{1.73_m2} (ref >59)
GFR calc non Af Amer: 88 mL/min/{1.73_m2} (ref >59)
Globulin, Total: 2.8 g/dL (ref 1.5–4.5)
Glucose: 99 mg/dL (ref 65–99)
Potassium: 4.2 mmol/L (ref 3.5–5.2)
Sodium: 139 mmol/L (ref 134–144)
Total Protein: 7 g/dL (ref 6.0–8.5)

## 2019-06-27 LAB — LIPID PANEL
Chol/HDL Ratio: 2.3 ratio (ref 0.0–4.4)
Cholesterol, Total: 118 mg/dL (ref 100–199)
HDL: 52 mg/dL (ref >39)
LDL Chol Calc (NIH): 53 mg/dL (ref 0–99)
Triglycerides: 57 mg/dL (ref 0–149)
VLDL Cholesterol Cal: 13 mg/dL (ref 5–40)

## 2019-06-27 LAB — TSH: TSH: 3 u[IU]/mL (ref 0.450–4.500)

## 2019-06-27 LAB — VITAMIN D 25 HYDROXY (VIT D DEFICIENCY, FRACTURES): Vit D, 25-Hydroxy: 19.5 ng/mL — ABNORMAL LOW (ref 30.0–100.0)

## 2019-06-28 ENCOUNTER — Telehealth: Payer: Self-pay

## 2019-06-28 ENCOUNTER — Telehealth: Payer: Self-pay | Admitting: Adult Health

## 2019-06-28 DIAGNOSIS — E559 Vitamin D deficiency, unspecified: Secondary | ICD-10-CM

## 2019-06-28 NOTE — Telephone Encounter (Signed)
Copied from CRM [id number]. Topic: General - Other >> Jun 28, 2019  2:41 PM Mary Wall wrote: Reason for CRM: Pt called and is requesting to have a call back regarding her lab results. Please advise.

## 2019-06-28 NOTE — Telephone Encounter (Signed)
Mary Wall from [facility] wants to know if pt still has a breast mass or you trying to f/u on the ultrasound last done at Dr Rutherford Nail office,Thanks

## 2019-06-28 NOTE — Telephone Encounter (Signed)
This mass is still present this is not a new problem so this I guess would be considered a follow up from previous images. KW

## 2019-06-29 NOTE — Telephone Encounter (Signed)
Spoke with patient on phone who inquired about her lab results and if they have been reviewed? I see labs are in chart if you would please review and advise. KW

## 2019-06-29 NOTE — Telephone Encounter (Signed)
All blood tests are normal except vitamin D is very low. Need Vitamin D 50,000 IU once a week #12 and recheck level in 3 months.

## 2019-06-30 ENCOUNTER — Other Ambulatory Visit: Payer: Self-pay

## 2019-06-30 MED ORDER — VITAMIN D (ERGOCALCIFEROL) 1.25 MG (50000 UNIT) PO CAPS: 50000.0000 [IU] | ORAL_CAPSULE | ORAL | 0 refills | Status: DC

## 2019-07-04 NOTE — Addendum Note (Signed)
Addended by: Hyacinth Meeker on: 07/04/2019 11:16 AM   Modules accepted: Orders

## 2019-07-04 NOTE — Telephone Encounter (Signed)
Patient advised as below. Patient verbalizes understanding and is in agreement with treatment plan.  

## 2019-07-13 ENCOUNTER — Other Ambulatory Visit: Payer: PRIVATE HEALTH INSURANCE

## 2019-07-17 ENCOUNTER — Encounter: Payer: Self-pay | Admitting: Obstetrics and Gynecology

## 2019-07-24 ENCOUNTER — Ambulatory Visit: Payer: Self-pay | Admitting: Adult Health

## 2019-07-28 ENCOUNTER — Ambulatory Visit
Admission: RE | Admit: 2019-07-28 | Discharge: 2019-07-28 | Disposition: A | Discharge status: Home / Self Care | Payer: BC Managed Care – PPO | Source: Ambulatory Visit | Attending: Family Medicine | Admitting: Family Medicine

## 2019-07-28 ENCOUNTER — Ambulatory Visit
Admission: RE | Admit: 2019-07-28 | Discharge: 2019-07-28 | Disposition: A | Discharge status: Home / Self Care | Payer: BC Managed Care – PPO | Source: Ambulatory Visit | Attending: Adult Health | Admitting: Adult Health

## 2019-07-28 DIAGNOSIS — N6310 Unspecified lump in the right breast, unspecified quadrant: Secondary | ICD-10-CM

## 2019-07-28 DIAGNOSIS — R922 Inconclusive mammogram: Secondary | ICD-10-CM | POA: Diagnosis not present

## 2019-07-28 NOTE — Progress Notes (Signed)
RECOMMENDATION: Screening mammogram at age 29 unless there are persistent or intervening clinical concerns. (Code:[id number])

## 2019-08-07 ENCOUNTER — Encounter: Payer: Self-pay | Admitting: Obstetrics and Gynecology

## 2019-08-18 ENCOUNTER — Ambulatory Visit: Payer: BC Managed Care – PPO

## 2019-08-25 ENCOUNTER — Ambulatory Visit: Payer: BC Managed Care – PPO

## 2019-08-25 ENCOUNTER — Ambulatory Visit (LOCAL_COMMUNITY_HEALTH_CENTER): Payer: BC Managed Care – PPO | Admitting: Physician Assistant

## 2019-08-25 ENCOUNTER — Encounter: Payer: Self-pay | Admitting: Physician Assistant

## 2019-08-25 ENCOUNTER — Other Ambulatory Visit: Payer: Self-pay

## 2019-08-25 VITALS — BP 137/96 | Ht 62.0 in | Wt 135.0 lb

## 2019-08-25 DIAGNOSIS — Z0000 Encounter for general adult medical examination without abnormal findings: Secondary | ICD-10-CM

## 2019-08-25 DIAGNOSIS — Z3046 Encounter for surveillance of implantable subdermal contraceptive: Secondary | ICD-10-CM

## 2019-08-25 DIAGNOSIS — Z30017 Encounter for initial prescription of implantable subdermal contraceptive: Secondary | ICD-10-CM

## 2019-08-25 DIAGNOSIS — Z3009 Encounter for other general counseling and advice on contraception: Secondary | ICD-10-CM | POA: Diagnosis not present

## 2019-08-25 LAB — PREGNANCY, URINE: Preg Test, Ur: NEGATIVE

## 2019-08-25 MED ORDER — ETONOGESTREL 68 MG ~~LOC~~ IMPL
68.0000 mg | DRUG_IMPLANT | Freq: Once | SUBCUTANEOUS | Status: AC
  Administered 2019-08-25: 68 mg via SUBCUTANEOUS

## 2019-08-25 MED ORDER — THERA VITAL M PO TABS: 1.0000 | ORAL_TABLET | Freq: Every day | ORAL | 0 refills | Status: AC

## 2019-08-25 NOTE — Progress Notes (Signed)
Pt is here for physical and Nexplanon removal. Pt reports last sex was about 2.5 to 3 weeks ago without condom. Pt reports Nexplanon was placed ~04/2014 at [facility]. Pt reports she may want another Nexplanon placed.

## 2019-08-25 NOTE — Progress Notes (Signed)
UPT is negative today. Pt received MVI's per pt request and per standing order. ROI for [facility] for pap record obtained and signed by pt per provider order. ROI faxed and fax confirmation received. Provider orders completed.

## 2019-08-25 NOTE — Progress Notes (Signed)
[facility] DEPARTMENT [facility] [address] Main Number: [phone]    Family Planning Visit- Initial Visit  Subjective:  Mary Wall is a 29 y.o.  [id number]   being seen today for an initial well woman visit and to discuss family planning options.  She is currently using None for pregnancy prevention. Patient reports she does not want a pregnancy in the next year.  Patient has the following medical conditions has Anemia; Mass of right breast; Nexplanon insertion; Overweight with body mass index (BMI) 25.0-29.9; Nausea; Hematuria; Left upper quadrant abdominal pain- with palpation only ; Gastroesophageal reflux disease without esophagitis; and Encounter for surveillance of contraceptives on their problem list.  Chief Complaint  Patient presents with  . Contraception    Physical and Nexplanon removal    Patient reports that she has had her Nexplanon in place for a little over 5 years and would like a removal and reinsertion today.  States that she has seen her PCP last month and is followed for Anemia, Acid Reflux, and Migraine headaches.  Reports that she had a pap last year that was normal.   Patient denies any other concerns today.     Body mass index is 24.69 kg/m. - Patient is eligible for diabetes screening based on BMI and age >39?  not applicable HA1C ordered? not applicable  Patient reports 1 of partners in last year. Desires STI screening?  No - patient declines.  Has patient been screened once for HCV in the past?  No  No results found for: HCVAB  Does the patient have current drug use (including MJ), have a partner with drug use, and/or has been incarcerated since last result? No  If yes-- Screen for HCV through [facility]   Does the patient meet criteria for HBV testing? No  Criteria:  -Household, sexual or needle sharing contact with HBV -History of drug use -HIV positive -Those with known Hep C   Health  Maintenance Due  Topic Date Due  . Hepatitis C Screening  Never done  . COVID-19 Vaccine (1) Never done  . HIV Screening  Never done  . TETANUS/TDAP  Never done  . PAP SMEAR-Modifier  Never done  . INFLUENZA VACCINE  08/06/2019    Review of Systems  All other systems reviewed and are negative.   The following portions of the patient's history were reviewed and updated as appropriate: allergies, current medications, past family history, past medical history, past social history, past surgical history and problem list. Problem list updated.   See flowsheet for other program required questions.  Objective:   Vitals:   08/25/19 0857  BP: (!) 137/96  Weight: 135 lb (61.2 kg)  Height: 5\' 2"  (1.575 m)    Physical Exam Vitals and nursing note reviewed.  Constitutional:      General: She is not in acute distress.    Appearance: Normal appearance.  HENT:     Head: Normocephalic and atraumatic.  Eyes:     Conjunctiva/sclera: Conjunctivae normal.  Neck:     Thyroid: No thyroid mass, thyromegaly or thyroid tenderness.  Cardiovascular:     Rate and Rhythm: Normal rate and regular rhythm.  Pulmonary:     Effort: Pulmonary effort is normal.     Breath sounds: Normal breath sounds.  Musculoskeletal:     Cervical back: Neck supple. No tenderness.  Lymphadenopathy:     Cervical: No cervical adenopathy.  Skin:    General: Skin is  warm and dry.     Findings: No bruising, erythema or lesion.  Neurological:     Mental Status: She is alert and oriented to person, place, and time.  Psychiatric:        Mood and Affect: Mood normal.        Behavior: Behavior normal.        Thought Content: Thought content normal.        Judgment: Judgment normal.       Assessment and Plan:  [name] is a 29 y.o. female presenting to the [facility] Department for an initial well woman exam/family planning visit  Contraception counseling: Reviewed all forms of birth control  options in the tiered based approach. available including abstinence; over the counter/barrier methods; hormonal contraceptive medication including pill, patch, ring, injection,contraceptive implant, ECP; hormonal and nonhormonal IUDs; permanent sterilization options including vasectomy and the various tubal sterilization modalities. Risks, benefits, and typical effectiveness rates were reviewed.  Questions were answered.  Written information was also given to the patient to review.  Patient desires Nexplanon removal/reinsertion, this was prescribed for patient. She will follow up in  1 year and prn for surveillance.  She was told to call with any further questions, or with any concerns about this method of contraception.  Emphasized use of condoms 100% of the time for STI prevention.  Patient was not a candidate for ECP today.   1. Family planning services See above.  2. Encounter for counseling regarding contraception Counseled patient that Nexplanon is effective for 3 years and for some people up to 4 years. Rec condoms with all sex for 10 days after reinsertion and enc always. Pregnancy test was negative today.  - Pregnancy, urine - Multiple Vitamins-Minerals (MULTIVITAMIN) tablet; Take 1 tablet by mouth daily.  Dispense: 100 tablet; Refill: 0  3. Encounter for removal and reinsertion of Nexplanon Nexplanon Removal and Insertion  Patient identified, informed consent performed, consent signed.   Patient does understand that irregular bleeding is a very common side effect of this medication. She was advised to have backup contraception for one week after replacement of the implant. Patient deemed to meet WHO criteria for being reasonably certain she is not pregnant.  Appropriate time out taken. Nexplanon site identified. Area prepped in usual sterile fashon. 2 ml of 1% lidocaine with epinephrine was used to anesthetize the area at the distal end of the implant. A small stab incision was made right  beside the implant on the distal portion. The Nexplanon rod was grasped using hemostats and removed without difficulty. There was minimal blood loss. There were no complications.   Confirmed correct location of insertion site. The insertion site was identified 8-10 cm (3-4 inches) from the medial epicondyle of the humerus and 3-5 cm (1.25-2 inches) posterior to (below) the sulcus (groove) between the biceps and triceps muscles of the patient's left arm. New Nexplanon removed from packaging, Device confirmed in needle, then inserted full length of needle and withdrawn per handbook instructions. Nexplanon was able to palpated in the patient's left arm; patient palpated the insert herself.  There was minimal blood loss. Patient insertion site covered with guaze and a pressure bandage to reduce any bruising. The patient tolerated the procedure well and was given post procedure instructions.    Nexplanon:   Counseled patient to take OTC analgesic starting as soon as lidocaine starts to wear off and take regularly for at least 48 hr to decrease discomfort.  Specifically to take with  food or milk to decrease stomach upset and for IB 600 mg (3 tablets) every 6 hrs; IB 800 mg (4 tablets) every 8 hrs; or Aleve 2 tablets every 12 hrs.   - etonogestrel (NEXPLANON) implant 68 mg  4. Well woman exam (no gynecological exam) Reviewed with patient healthy habits to maintain normal BMI. Enc MVI 1 po daily. Enc to continue regular follow up with PCP for chronic conditions, evaluation for elevated BP and illness.    Return for annual and PRN.  No future appointments.  Matt Holmes, PA

## 2019-09-22 ENCOUNTER — Encounter: Payer: Self-pay | Admitting: Physician Assistant

## 2019-09-22 NOTE — Progress Notes (Signed)
ROI sent to [facility] for patient's pap smear results.  Per patient that is where she had her last pap.  Per response from [facility], "No pap records for patient!".

## 2019-09-27 ENCOUNTER — Ambulatory Visit: Payer: BC Managed Care – PPO | Admitting: Adult Health

## 2019-10-11 NOTE — Progress Notes (Deleted)
     Established patient visit   Patient: Mary Wall   DOB: December 27, 1990   29 y.o. Female  MRN: [id number] Visit Date: 10/12/2019  Today's healthcare provider: Jairo Ben, FNP   No chief complaint on file.  Subjective    HPI  Patient presents in office today for gynecology examination, she states that she feels well today and has no other concerns to address.   {Show patient history (optional):23778::" "}   Medications: Outpatient Medications Prior to Visit  Medication Sig  . butalbital-acetaminophen-caffeine (FIORICET WITH CODEINE) 50-325-40-30 MG capsule Take by mouth.  . Multiple Vitamins-Minerals (MULTIVITAMIN) tablet Take 1 tablet by mouth daily.  . ondansetron (ZOFRAN) 4 MG tablet Take 1 tablet (4 mg total) by mouth every 8 (eight) hours as needed for nausea or vomiting.  . pantoprazole (PROTONIX) 40 MG tablet Take 1 tablet (40 mg total) by mouth daily.  . Vitamin D, Ergocalciferol, (DRISDOL) 1.25 MG (50000 UNIT) CAPS capsule Take 1 capsule (50,000 Units total) by mouth every 7 (seven) days.   No facility-administered medications prior to visit.    Review of Systems  {Heme  Chem  Endocrine  Serology  Results Review (optional):23779::" "}  Objective    There were no vitals taken for this visit. {Show previous vital signs (optional):23777::" "}  Physical Exam  ***  No results found for any visits on 10/12/19.  Assessment & Plan     ***  No follow-ups on file.      {provider attestation***:1}   Jairo Ben, FNP  [facility] [phone] (phone) [phone] (fax)  [facility]

## 2019-10-12 ENCOUNTER — Ambulatory Visit: Payer: BC Managed Care – PPO | Admitting: Adult Health

## 2020-03-04 ENCOUNTER — Encounter: Payer: Self-pay | Admitting: Adult Health

## 2020-03-04 NOTE — Progress Notes (Signed)
Letter for dismissal due to 3 no show appointments. See communications.

## 2020-03-07 DIAGNOSIS — G43909 Migraine, unspecified, not intractable, without status migrainosus: Secondary | ICD-10-CM | POA: Diagnosis not present

## 2020-03-19 DIAGNOSIS — G43709 Chronic migraine without aura, not intractable, without status migrainosus: Secondary | ICD-10-CM | POA: Diagnosis not present

## 2020-03-19 DIAGNOSIS — R030 Elevated blood-pressure reading, without diagnosis of hypertension: Secondary | ICD-10-CM | POA: Diagnosis not present

## 2020-03-19 DIAGNOSIS — F419 Anxiety disorder, unspecified: Secondary | ICD-10-CM | POA: Diagnosis not present

## 2020-03-19 DIAGNOSIS — R631 Polydipsia: Secondary | ICD-10-CM | POA: Diagnosis not present

## 2020-03-19 DIAGNOSIS — E559 Vitamin D deficiency, unspecified: Secondary | ICD-10-CM | POA: Diagnosis not present

## 2020-03-21 ENCOUNTER — Ambulatory Visit: Payer: BC Managed Care – PPO

## 2020-04-30 DIAGNOSIS — F419 Anxiety disorder, unspecified: Secondary | ICD-10-CM

## 2020-04-30 DIAGNOSIS — E559 Vitamin D deficiency, unspecified: Secondary | ICD-10-CM | POA: Insufficient documentation

## 2020-04-30 DIAGNOSIS — G43709 Chronic migraine without aura, not intractable, without status migrainosus: Secondary | ICD-10-CM | POA: Diagnosis not present

## 2020-04-30 DIAGNOSIS — R030 Elevated blood-pressure reading, without diagnosis of hypertension: Secondary | ICD-10-CM | POA: Diagnosis not present

## 2020-04-30 HISTORY — DX: Anxiety disorder, unspecified: F41.9

## 2020-05-09 DIAGNOSIS — N939 Abnormal uterine and vaginal bleeding, unspecified: Secondary | ICD-10-CM | POA: Diagnosis not present

## 2020-06-02 DIAGNOSIS — J069 Acute upper respiratory infection, unspecified: Secondary | ICD-10-CM | POA: Diagnosis not present

## 2020-06-11 DIAGNOSIS — F419 Anxiety disorder, unspecified: Secondary | ICD-10-CM | POA: Diagnosis not present

## 2020-06-11 DIAGNOSIS — G43709 Chronic migraine without aura, not intractable, without status migrainosus: Secondary | ICD-10-CM | POA: Diagnosis not present

## 2020-06-11 DIAGNOSIS — R030 Elevated blood-pressure reading, without diagnosis of hypertension: Secondary | ICD-10-CM | POA: Diagnosis not present

## 2020-06-11 DIAGNOSIS — E559 Vitamin D deficiency, unspecified: Secondary | ICD-10-CM | POA: Diagnosis not present

## 2020-07-29 DIAGNOSIS — U071 COVID-19: Secondary | ICD-10-CM | POA: Diagnosis not present

## 2020-07-30 DIAGNOSIS — U071 COVID-19: Secondary | ICD-10-CM | POA: Diagnosis not present

## 2020-11-30 DIAGNOSIS — M25562 Pain in left knee: Secondary | ICD-10-CM | POA: Diagnosis not present

## 2021-03-06 DIAGNOSIS — E559 Vitamin D deficiency, unspecified: Secondary | ICD-10-CM | POA: Diagnosis not present

## 2021-03-06 DIAGNOSIS — Z0000 Encounter for general adult medical examination without abnormal findings: Secondary | ICD-10-CM | POA: Diagnosis not present

## 2021-03-06 DIAGNOSIS — Z1331 Encounter for screening for depression: Secondary | ICD-10-CM | POA: Diagnosis not present

## 2021-03-06 DIAGNOSIS — F419 Anxiety disorder, unspecified: Secondary | ICD-10-CM | POA: Diagnosis not present

## 2021-03-06 DIAGNOSIS — G43709 Chronic migraine without aura, not intractable, without status migrainosus: Secondary | ICD-10-CM | POA: Diagnosis not present

## 2021-03-06 DIAGNOSIS — Z131 Encounter for screening for diabetes mellitus: Secondary | ICD-10-CM | POA: Diagnosis not present

## 2021-03-06 DIAGNOSIS — R030 Elevated blood-pressure reading, without diagnosis of hypertension: Secondary | ICD-10-CM | POA: Diagnosis not present

## 2021-03-06 DIAGNOSIS — Z13220 Encounter for screening for lipoid disorders: Secondary | ICD-10-CM | POA: Diagnosis not present

## 2021-03-06 DIAGNOSIS — Z124 Encounter for screening for malignant neoplasm of cervix: Secondary | ICD-10-CM | POA: Diagnosis not present

## 2021-04-07 ENCOUNTER — Emergency Department
Admission: EM | Admit: 2021-04-07 | Discharge: 2021-04-08 | Disposition: A | Discharge status: Home / Self Care | Payer: BC Managed Care – PPO | Attending: Emergency Medicine | Admitting: Emergency Medicine

## 2021-04-07 ENCOUNTER — Other Ambulatory Visit: Payer: Self-pay

## 2021-04-07 DIAGNOSIS — R1084 Generalized abdominal pain: Secondary | ICD-10-CM | POA: Diagnosis not present

## 2021-04-07 DIAGNOSIS — K2920 Alcoholic gastritis without bleeding: Secondary | ICD-10-CM | POA: Insufficient documentation

## 2021-04-07 DIAGNOSIS — R1110 Vomiting, unspecified: Secondary | ICD-10-CM | POA: Diagnosis not present

## 2021-04-07 DIAGNOSIS — E876 Hypokalemia: Secondary | ICD-10-CM

## 2021-04-07 DIAGNOSIS — K529 Noninfective gastroenteritis and colitis, unspecified: Secondary | ICD-10-CM

## 2021-04-07 DIAGNOSIS — R109 Unspecified abdominal pain: Secondary | ICD-10-CM | POA: Diagnosis not present

## 2021-04-07 DIAGNOSIS — R824 Acetonuria: Secondary | ICD-10-CM | POA: Diagnosis not present

## 2021-04-07 LAB — CBC
HCT: 43.6 % (ref 36.0–46.0)
Hemoglobin: 14.6 g/dL (ref 12.0–15.0)
MCH: 28.9 pg (ref 26.0–34.0)
MCHC: 33.5 g/dL (ref 30.0–36.0)
MCV: 86.2 fL (ref 80.0–100.0)
Platelets: 251 10*3/uL (ref 150–400)
RBC: 5.06 MIL/uL (ref 3.87–5.11)
RDW: 13.3 % (ref 11.5–15.5)
WBC: 5.1 10*3/uL (ref 4.0–10.5)
nRBC: 0 % (ref 0.0–0.2)

## 2021-04-07 LAB — COMPREHENSIVE METABOLIC PANEL
ALT: 13 U/L (ref 0–44)
AST: 20 U/L (ref 15–41)
Albumin: 4 g/dL (ref 3.5–5.0)
Alkaline Phosphatase: 66 U/L (ref 38–126)
Anion gap: 14 (ref 5–15)
BUN: 10 mg/dL (ref 6–20)
CO2: 20 mmol/L — ABNORMAL LOW (ref 22–32)
Calcium: 8.7 mg/dL — ABNORMAL LOW (ref 8.9–10.3)
Chloride: 103 mmol/L (ref 98–111)
Creatinine, Ser: 0.8 mg/dL (ref 0.44–1.00)
GFR, Estimated: >60 mL/min (ref >60)
Glucose, Bld: 98 mg/dL (ref 70–99)
Potassium: 3.1 mmol/L — ABNORMAL LOW (ref 3.5–5.1)
Sodium: 137 mmol/L (ref 135–145)
Total Bilirubin: 0.6 mg/dL (ref 0.3–1.2)
Total Protein: 7.9 g/dL (ref 6.5–8.1)

## 2021-04-07 LAB — URINALYSIS, ROUTINE W REFLEX MICROSCOPIC
Bacteria, UA: NONE SEEN
Bilirubin Urine: NEGATIVE
Glucose, UA: NEGATIVE mg/dL
Ketones, ur: 20 mg/dL — AB
Leukocytes,Ua: NEGATIVE
Nitrite: NEGATIVE
Protein, ur: NEGATIVE mg/dL
Specific Gravity, Urine: 1.012 (ref 1.005–1.030)
pH: 6 (ref 5.0–8.0)

## 2021-04-07 LAB — POC URINE PREG, ED: Preg Test, Ur: NEGATIVE

## 2021-04-07 LAB — LIPASE, BLOOD: Lipase: 25 U/L (ref 11–51)

## 2021-04-07 MED ORDER — FAMOTIDINE IN NACL 20-0.9 MG/50ML-% IV SOLN
20.0000 mg | Freq: Once | INTRAVENOUS | Status: AC
  Administered 2021-04-07: 20 mg via INTRAVENOUS
  Filled 2021-04-07: qty 50

## 2021-04-07 MED ORDER — POTASSIUM CHLORIDE 10 MEQ/100ML IV SOLN
10.0000 meq | Freq: Once | INTRAVENOUS | Status: AC
  Administered 2021-04-08: 10 meq via INTRAVENOUS
  Filled 2021-04-07: qty 100

## 2021-04-07 MED ORDER — SODIUM CHLORIDE 0.9 % IV BOLUS
1000.0000 mL | Freq: Once | INTRAVENOUS | Status: AC
  Administered 2021-04-07: 1000 mL via INTRAVENOUS

## 2021-04-07 MED ORDER — ONDANSETRON HCL 4 MG/2ML IJ SOLN
4.0000 mg | Freq: Once | INTRAMUSCULAR | Status: AC
  Administered 2021-04-07: 4 mg via INTRAVENOUS
  Filled 2021-04-07: qty 2

## 2021-04-07 MED ORDER — THIAMINE HCL 100 MG PO TABS: 100.0000 mg | ORAL_TABLET | Freq: Every day | ORAL | Status: DC

## 2021-04-07 MED ORDER — LORAZEPAM 2 MG/ML IJ SOLN: 0.0000 mg | Freq: Four times a day (QID) | INTRAMUSCULAR | Status: DC

## 2021-04-07 NOTE — ED Provider Notes (Signed)
? ?[facility] ?Provider Note ? ? ? Event Date/Time  ? First MD Initiated Contact with Patient 04/07/21 2306   ?  (approximate) ? ? ?History  ? ?Abdominal Pain ? ? ?HPI ? ?Mary Wall is a 31 y.o. female who presents to the ED from home with a chief complaint of abdominal pain x3 days.  Patient endorses heavy drinking over the weekend, approximately 1 pint of liquor daily.  No history of DTs.  Reports bright red emesis and black looking stools.  Today has been the first day that she has not vomited.  Does take iron supplementation.  Denies anticoagulants.  Occasional NSAID use.  Denies fever, cough, chest pain, shortness of breath, dysuria.  Denies recent travel, trauma or antibiotic use. ?  ? ? ?Past Medical History  ? ?Past Medical History:  ?Diagnosis Date  ? Anemia   ? Blood transfusion without reported diagnosis   ? Breast disorder   ? right breast lump  ? Depression   ? Gastroesophageal reflux disease without esophagitis 06/23/2019  ? Migraines   ? Ovarian cyst   ? ? ? ?Active Problem List  ? ?Patient Active Problem List  ? Diagnosis Date Noted  ? Overweight with body mass index (BMI) 25.0-29.9 06/23/2019  ? Nausea 06/23/2019  ? Hematuria 06/23/2019  ? Left upper quadrant abdominal pain- with palpation only  06/23/2019  ? Gastroesophageal reflux disease without esophagitis 06/23/2019  ? Encounter for surveillance of contraceptives 06/23/2019  ? Mass of right breast 10/22/2017  ? Anemia 06/13/2014  ? Nexplanon insertion 05/04/2014  ? ? ? ?Past Surgical History  ? ?Past Surgical History:  ?Procedure Laterality Date  ? BREAST BIOPSY Right 2019  ? PASH  ? ? ? ?Home Medications  ? ?Prior to Admission medications   ?Medication Sig Start Date End Date Taking? Authorizing Provider  ?amoxicillin-clavulanate (AUGMENTIN) 875-125 MG tablet Take 1 tablet by mouth 2 (two) times daily. 04/08/21  Yes Irean Hong, MD  ?dicyclomine (BENTYL) 20 MG tablet Take 1 tablet (20 mg total) by mouth every 6 (six)  hours. 04/08/21  Yes Irean Hong, MD  ?ondansetron (ZOFRAN-ODT) 4 MG disintegrating tablet Take 1 tablet (4 mg total) by mouth every 8 (eight) hours as needed for nausea or vomiting. 04/08/21  Yes Irean Hong, MD  ?pantoprazole (PROTONIX) 40 MG tablet Take 1 tablet (40 mg total) by mouth daily. 04/08/21  Yes Irean Hong, MD  ?butalbital-acetaminophen-caffeine (FIORICET WITH CODEINE) [phone] MG capsule Take by mouth.    [provider]  ?Multiple Vitamins-Minerals (MULTIVITAMIN) tablet Take 1 tablet by mouth daily. 08/25/19   Matt Holmes, PA  ?ondansetron (ZOFRAN) 4 MG tablet Take 1 tablet (4 mg total) by mouth every 8 (eight) hours as needed for nausea or vomiting. 06/23/19   Flinchum, Eula Fried, FNP  ?Vitamin D, Ergocalciferol, (DRISDOL) 1.25 MG (50000 UNIT) CAPS capsule Take 1 capsule (50,000 Units total) by mouth every 7 (seven) days. 06/30/19   Flinchum, Eula Fried, FNP  ? ? ? ?Allergies  ?Patient has no known allergies. ? ? ?Family History  ? ?Family History  ?Problem Relation Age of Onset  ? Cancer Maternal Grandfather   ? Hypertension Maternal Grandfather   ? Bone cancer Maternal Grandfather   ? Hypertension Mother   ? Diabetes Mother   ? Hypertension Maternal Grandmother   ? Diabetes Maternal Grandmother   ? Seizures Maternal Grandmother   ? Heart attack Maternal Grandmother   ? Hypertension Sister   ?  Diabetes Sister   ? Migraines Sister   ? ? ? ?Physical Exam  ?Triage Vital Signs: ?ED Triage Vitals  ?Enc Vitals Group  ?   BP 04/07/21 1909 (!) 170/110  ?   Pulse Rate 04/07/21 1909 94  ?   Resp 04/07/21 1909 16  ?   Temp 04/07/21 1909 98.7 ?F (37.1 ?C)  ?   Temp Source 04/07/21 1909 Oral  ?   SpO2 04/07/21 1909 98 %  ?   Weight 04/07/21 1912 135 lb (61.2 kg)  ?   Height 04/07/21 1912 5\' 1"  (1.549 m)  ?   Head Circumference --   ?   Peak Flow --   ?   Pain Score 04/07/21 1910 6  ?   Pain Loc --   ?   Pain Edu? --   ?   Excl. in GC? --   ? ? ?Updated Vital Signs: ?BP 118/87   Pulse 70   Temp  98.7 ?F (37.1 ?C) (Oral)   Resp 18   Ht 5\' 1"  (1.549 m)   Wt 61.2 kg   SpO2 100%   BMI 25.51 kg/m?  ? ? ?General: Awake, no distress.  ?CV:  RRR.  Good peripheral perfusion.  ?Resp:  Normal effort.  CTA B. ?Abd:  Mild generalized tenderness to palpation without rebound or guarding.  No distention.  ?Other:  Rectal exam reveals black looking stool which is heme negative ? ? ?ED Results / Procedures / Treatments  ?Labs ?(all labs ordered are listed, but only abnormal results are displayed) ?Labs Reviewed  ?COMPREHENSIVE METABOLIC PANEL - Abnormal; Notable for the following components:  ?    Result Value  ? Potassium 3.1 (*)   ? CO2 20 (*)   ? Calcium 8.7 (*)   ? All other components within normal limits  ?URINALYSIS, ROUTINE W REFLEX MICROSCOPIC - Abnormal; Notable for the following components:  ? Color, Urine YELLOW (*)   ? APPearance CLEAR (*)   ? Hgb urine dipstick MODERATE (*)   ? Ketones, ur 20 (*)   ? All other components within normal limits  ?POC URINE PREG, ED - Normal  ?LIPASE, BLOOD  ?CBC  ? ? ? ?EKG ? ?None ? ? ?RADIOLOGY ?I have independently visualized and reviewed patient's CT scan as well as noted the radiology interpretation: ? ?CT abdomen pelvis: Transverse colitis ? ?Official radiology report(s): ?CT Abdomen Pelvis W Contrast ? ?Result Date: 04/08/2021 ?CLINICAL DATA:  Nausea, vomiting and abdominal pain. Also indicates bright bright red emesis and black stools. EXAM: CT ABDOMEN AND PELVIS WITH CONTRAST TECHNIQUE: Multidetector CT imaging of the abdomen and pelvis was performed using the standard protocol following bolus administration of intravenous contrast. RADIATION DOSE REDUCTION: This exam was performed according to the departmental dose-optimization program which includes automated exposure control, adjustment of the mA and/or kV according to patient size and/or use of iterative reconstruction technique. CONTRAST:  80mL OMNIPAQUE IOHEXOL 300 MG/ML  SOLN COMPARISON:  None. FINDINGS: Lower  chest: No acute abnormality. Hepatobiliary: The gallbladder common bile ducts and hepatic parenchyma are unremarkable. Pancreas: Unremarkable. Spleen: Normal in size with uniform enhancement. Adrenals/Urinary Tract: There is no adrenal mass or focal abnormality of the renal cortex. No evidence of urinary stones or obstruction. No bladder thickening is seen. Stomach/Bowel: Small hiatal hernia. Unremarkable gastric wall and unopacified small bowel. There is a normal appendix. There is fluid in the ascending colon. There are thickened folds versus nondistention in the mid/distal transverse colon. Left  colonic diverticula without inflammatory changes. Vascular/Lymphatic: There is left-sided pelvic venous congestion extending to the outer left uterine wall. No other significant vascular findings. There are no enlarged abdominal or pelvic nodes. Reproductive: The uterus is intact. Both ovaries are normal in size. Other: There is a small volume of pelvic cul-de-sac low-density fluid extending to the posterior right adnexa. There is no free hemorrhage, free air or abscess. Small umbilical fat hernia. Musculoskeletal: No acute or significant osseous findings. Slight lumbar levoscoliosis. Mild symmetric features of likely chronic sacroiliitis. IMPRESSION: 1. Transverse colitis versus nondistention, with scattered diverticulosis. Fluid in the ascending colon. 2. No small bowel obstruction or inflammatory changes. 3. Small hiatal hernia, small umbilical fat hernia. 4. Left pelvic venous congestion. 5. Small volume of low-density pelvic cul-de-sac fluid, nonspecific but frequently physiologic at this age. No free air. 6. Bilateral sacroiliitis, likely chronic. Electronically Signed   By: Almira Bar M.D.   On: 04/08/2021 00:28   ? ? ?PROCEDURES: ? ?Critical Care performed: No ? ?Procedures ? ? ?MEDICATIONS ORDERED IN ED: ?Medications  ?thiamine tablet 100 mg (has no administration in time range)  ?LORazepam (ATIVAN) injection  0-4 mg (0 mg Intravenous Not Given 04/08/21 0046)  ?sodium chloride 0.9 % bolus 1,000 mL (0 mLs Intravenous Stopped 04/08/21 0252)  ?famotidine (PEPCID) IVPB 20 mg premix (0 mg Intravenous Stopped 04/08/21 0025)  ?

## 2021-04-07 NOTE — ED Triage Notes (Signed)
Pt presents to ER c/o generalized abd pain since Saturday.  Pt states she has been having bright red emesis and black looking stool.  Pt denies diarrhea at home.  Pt denies hx of anemia or previous transfusions.  No prior hx of abd problems or surgeries.  Pt appears to have good color in triage, and denies sob.  Pt A&O x4 at this time in NAD.   ?

## 2021-04-08 ENCOUNTER — Emergency Department: Payer: BC Managed Care – PPO

## 2021-04-08 ENCOUNTER — Other Ambulatory Visit: Payer: PRIVATE HEALTH INSURANCE

## 2021-04-08 DIAGNOSIS — R109 Unspecified abdominal pain: Secondary | ICD-10-CM | POA: Diagnosis not present

## 2021-04-08 DIAGNOSIS — R1110 Vomiting, unspecified: Secondary | ICD-10-CM | POA: Diagnosis not present

## 2021-04-08 MED ORDER — PANTOPRAZOLE SODIUM 40 MG PO TBEC: 40.0000 mg | DELAYED_RELEASE_TABLET | Freq: Every day | ORAL | 0 refills | Status: DC

## 2021-04-08 MED ORDER — AMOXICILLIN-POT CLAVULANATE 875-125 MG PO TABS: 1.0000 | ORAL_TABLET | Freq: Two times a day (BID) | ORAL | 0 refills | Status: DC

## 2021-04-08 MED ORDER — ONDANSETRON 4 MG PO TBDP
4.0000 mg | ORAL_TABLET | Freq: Three times a day (TID) | ORAL | 0 refills | Status: DC | PRN
Start: 2021-04-08 — End: 2022-10-29

## 2021-04-08 MED ORDER — IOHEXOL 300 MG/ML  SOLN
80.0000 mL | Freq: Once | INTRAMUSCULAR | Status: AC | PRN
  Administered 2021-04-08: 100 mL via INTRAVENOUS

## 2021-04-08 MED ORDER — IOHEXOL 300 MG/ML  SOLN
80.0000 mL | Freq: Once | INTRAMUSCULAR | Status: AC | PRN
  Administered 2021-04-08: 80 mL via INTRAVENOUS

## 2021-04-08 MED ORDER — DICYCLOMINE HCL 20 MG PO TABS: 20.0000 mg | ORAL_TABLET | Freq: Four times a day (QID) | ORAL | 0 refills | Status: DC

## 2021-04-08 MED ORDER — PIPERACILLIN-TAZOBACTAM 3.375 G IVPB 30 MIN
3.3750 g | Freq: Once | INTRAVENOUS | Status: AC
  Administered 2021-04-08: 3.375 g via INTRAVENOUS
  Filled 2021-04-08: qty 50

## 2021-04-08 NOTE — Discharge Instructions (Signed)
1.  Take antibiotic as prescribed (Augmentin 875 mg twice daily x7 days). ?2.  Start Protonix 40 mg daily. ?3.  You may take medicines as needed for abdominal discomfort and nausea (Bentyl/Zofran #20). ?4.  Return to the ER for worsening symptoms, persistent vomiting, difficulty breathing or other concerns. ?

## 2021-04-16 DIAGNOSIS — N870 Mild cervical dysplasia: Secondary | ICD-10-CM | POA: Diagnosis not present

## 2021-04-16 DIAGNOSIS — E876 Hypokalemia: Secondary | ICD-10-CM | POA: Diagnosis not present

## 2021-04-16 DIAGNOSIS — R1084 Generalized abdominal pain: Secondary | ICD-10-CM | POA: Diagnosis not present

## 2021-04-16 DIAGNOSIS — N72 Inflammatory disease of cervix uteri: Secondary | ICD-10-CM | POA: Diagnosis not present

## 2021-04-16 DIAGNOSIS — R87619 Unspecified abnormal cytological findings in specimens from cervix uteri: Secondary | ICD-10-CM | POA: Diagnosis not present

## 2021-04-16 DIAGNOSIS — K2920 Alcoholic gastritis without bleeding: Secondary | ICD-10-CM | POA: Diagnosis not present

## 2021-04-16 DIAGNOSIS — K529 Noninfective gastroenteritis and colitis, unspecified: Secondary | ICD-10-CM | POA: Diagnosis not present

## 2022-04-10 ENCOUNTER — Encounter: Payer: Self-pay | Admitting: Oncology

## 2022-04-10 ENCOUNTER — Emergency Department
Admission: EM | Admit: 2022-04-10 | Discharge: 2022-04-10 | Disposition: A | Discharge status: Home / Self Care | Payer: 59 | Attending: Emergency Medicine | Admitting: Emergency Medicine

## 2022-04-10 ENCOUNTER — Emergency Department: Payer: 59

## 2022-04-10 DIAGNOSIS — O219 Vomiting of pregnancy, unspecified: Secondary | ICD-10-CM | POA: Diagnosis not present

## 2022-04-10 DIAGNOSIS — Z3A00 Weeks of gestation of pregnancy not specified: Secondary | ICD-10-CM | POA: Diagnosis not present

## 2022-04-10 LAB — COMPREHENSIVE METABOLIC PANEL
ALT: 24 U/L (ref 0–44)
AST: 29 U/L (ref 15–41)
Albumin: 4.3 g/dL (ref 3.5–5.0)
Alkaline Phosphatase: 68 U/L (ref 38–126)
Anion gap: 12 (ref 5–15)
BUN: 10 mg/dL (ref 6–20)
CO2: 20 mmol/L — ABNORMAL LOW (ref 22–32)
Calcium: 9.1 mg/dL (ref 8.9–10.3)
Chloride: 103 mmol/L (ref 98–111)
Creatinine, Ser: 0.85 mg/dL (ref 0.44–1.00)
GFR, Estimated: >60 mL/min (ref >60)
Glucose, Bld: 85 mg/dL (ref 70–99)
Potassium: 3.9 mmol/L (ref 3.5–5.1)
Sodium: 135 mmol/L (ref 135–145)
Total Bilirubin: 0.9 mg/dL (ref 0.3–1.2)
Total Protein: 8.2 g/dL — ABNORMAL HIGH (ref 6.5–8.1)

## 2022-04-10 LAB — CBC WITH DIFFERENTIAL/PLATELET
Abs Immature Granulocytes: 0.01 10*3/uL (ref 0.00–0.07)
Basophils Absolute: 0.1 10*3/uL (ref 0.0–0.1)
Basophils Relative: 1 %
Eosinophils Absolute: 0 10*3/uL (ref 0.0–0.5)
Eosinophils Relative: 0 %
HCT: 44.6 % (ref 36.0–46.0)
Hemoglobin: 14.9 g/dL (ref 12.0–15.0)
Immature Granulocytes: 0 %
Lymphocytes Relative: 23 %
Lymphs Abs: 1.4 10*3/uL (ref 0.7–4.0)
MCH: 29.4 pg (ref 26.0–34.0)
MCHC: 33.4 g/dL (ref 30.0–36.0)
MCV: 88.1 fL (ref 80.0–100.0)
Monocytes Absolute: 0.4 10*3/uL (ref 0.1–1.0)
Monocytes Relative: 7 %
Neutro Abs: 4.3 10*3/uL (ref 1.7–7.7)
Neutrophils Relative %: 69 %
Platelets: 238 10*3/uL (ref 150–400)
RBC: 5.06 MIL/uL (ref 3.87–5.11)
RDW: 12.7 % (ref 11.5–15.5)
WBC: 6.3 10*3/uL (ref 4.0–10.5)
nRBC: 0 % (ref 0.0–0.2)

## 2022-04-10 LAB — POC URINE PREG, ED: Preg Test, Ur: POSITIVE — AB

## 2022-04-10 LAB — LIPASE, BLOOD: Lipase: 31 U/L (ref 11–51)

## 2022-04-10 LAB — HCG, QUANTITATIVE, PREGNANCY: hCG, Beta Chain, Quant, S: 620 m[IU]/mL — ABNORMAL HIGH (ref <5)

## 2022-04-10 MED ORDER — PYRIDOXINE HCL 25 MG PO TABS
25.0000 mg | ORAL_TABLET | Freq: Every day | ORAL | Status: DC
  Administered 2022-04-10: 25 mg via ORAL
  Filled 2022-04-10: qty 1

## 2022-04-10 MED ORDER — VITAMIN B-6 25 MG PO TABS: 25.0000 mg | ORAL_TABLET | Freq: Every day | ORAL | 0 refills | Status: DC

## 2022-04-10 MED ORDER — SODIUM CHLORIDE 0.9 % IV BOLUS: 1000.0000 mL | Freq: Once | INTRAVENOUS | Status: DC

## 2022-04-10 NOTE — ED Notes (Signed)
Pt taken to US at this time

## 2022-04-10 NOTE — ED Notes (Signed)
Pt back from [place], Medic Brandy at bedside placing IV and will administer medications

## 2022-04-10 NOTE — Discharge Instructions (Signed)
You may take the nausea medicine to help with your symptoms as prescribed.  Please follow-up with Dr. Dalbert Garnet in the next 7 days for a repeat blood hCG test and ultrasound, as your pregnancy is unable to be identified right now given that it is early in pregnancy.  Please return emergency department if you develop worsening pain, vaginal bleeding, worsening symptoms, or any new symptoms.  It was a pleasure caring for you today.

## 2022-04-10 NOTE — ED Triage Notes (Signed)
Pt presents to the ED via POV due to vomiting. Pt recently had birth control removed on 03/09/2022. Pt  states she is vomiting and having troubles eating. Pt states she is unaware of her last period because she was on the birth contrl; and had no periods. Pt has OB appt 04/29/2021. Pt NAD A&O x4

## 2022-04-10 NOTE — ED Provider Notes (Signed)
[facility] Provider Note    Event Date/Time   First MD Initiated Contact with Patient 04/10/22 1140     (approximate)   History   Emesis   HPI  Mary Wall is a 32 y.o. female G3 P2 who presents today for evaluation of nausea for the past several days.  Patient reports that she had her Nexplanon removed last month, and did not think that she could get pregnant so quickly.  She reports that she took a pregnancy test a couple of days ago and it was positive.  She reports that she has mild pelvic discomfort, worse on the right side.  She denies any vaginal discharge or bleeding.  She has not had any fevers or chills.  She denies flank pain.  She denies complications with her previous pregnancies.  She called her OB/GYN who recommended that she come to the emergency department for fluids.     Physical Exam   Triage Vital Signs: ED Triage Vitals  Enc Vitals Group     BP 04/10/22 1124 (!) 169/122     Pulse Rate 04/10/22 1124 100     Resp 04/10/22 1124 18     Temp 04/10/22 1124 98.5 F (36.9 C)     Temp src --      SpO2 04/10/22 1124 100 %     Weight --      Height --      Head Circumference --      Peak Flow --      Pain Score 04/10/22 1125 0     Pain Loc --      Pain Edu? --      Excl. in GC? --     Most recent vital signs: Vitals:   04/10/22 1124  BP: (!) 169/122  Pulse: 100  Resp: 18  Temp: 98.5 F (36.9 C)  SpO2: 100%    Physical Exam Vitals and nursing note reviewed.  Constitutional:      General: Awake and alert. No acute distress.    Appearance: Normal appearance. The patient is normal weight.  HENT:     Head: Normocephalic and atraumatic.     Mouth: Mucous membranes are moist.  Eyes:     General: PERRL. Normal EOMs        Right eye: No discharge.        Left eye: No discharge.     Conjunctiva/sclera: Conjunctivae normal.  Cardiovascular:     Rate and Rhythm: Normal rate and regular rhythm.     Pulses: Normal pulses.      Heart sounds: Normal heart sounds Pulmonary:     Effort: Pulmonary effort is normal. No respiratory distress.     Breath sounds: Normal breath sounds.  Abdominal:     Abdomen is soft. There is no abdominal tenderness. No rebound or guarding. No distention. Musculoskeletal:        General: No swelling. Normal range of motion.     Cervical back: Normal range of motion and neck supple.  Skin:    General: Skin is warm and dry.     Capillary Refill: Capillary refill takes less than 2 seconds.     Findings: No rash.  Neurological:     Mental Status: The patient is awake and alert.      ED Results / Procedures / Treatments   Labs (all labs ordered are listed, but only abnormal results are displayed) Labs Reviewed  HCG, QUANTITATIVE, PREGNANCY - Abnormal; Notable for  the following components:      Result Value   hCG, Beta Chain, Quant, S 620 (*)    All other components within normal limits  COMPREHENSIVE METABOLIC PANEL - Abnormal; Notable for the following components:   CO2 20 (*)    Total Protein 8.2 (*)    All other components within normal limits  POC URINE PREG, ED - Abnormal; Notable for the following components:   Preg Test, Ur POSITIVE (*)    All other components within normal limits  CBC WITH DIFFERENTIAL/PLATELET  LIPASE, BLOOD     EKG     RADIOLOGY I independently reviewed and interpreted imaging and agree with radiologists findings.     PROCEDURES:  Critical Care performed:   Procedures   MEDICATIONS ORDERED IN ED: Medications  sodium chloride 0.9 % bolus 1,000 mL (1,000 mLs Intravenous Not Given 04/10/22 1244)  pyridOXINE (VITAMIN B6) tablet 25 mg (25 mg Oral Given 04/10/22 1244)     IMPRESSION / MDM / ASSESSMENT AND PLAN / ED COURSE  I reviewed the triage vital signs and the nursing notes.   Differential diagnosis includes, but is not limited to, ectopic pregnancy, nausea in pregnancy, hyperemesis gravidarum, electrolyte disturbance, normal  intrauterine pregnancy.  I reviewed the patient's chart.  Patient had her Nexplanon removed 03/09/2022 by Dr. Dalbert Garnet.  Labs obtained in triage are overall reassuring.  Her hCG is mildly elevated to 620.  She has no vaginal bleeding or spotting.  Ultrasound reveals a small fluid collection in the endometrial canal of the uterine fundus compatible either with early intrauterine gestation versus pregnancy of unknown location.  Based on serum hCG, patient is very early in pregnancy.  I recommended that she have close follow-up with OB/GYN for repeat beta-hCG for hCG trending as well as a follow-up ultrasound.  We discussed the importance of this prompt follow-up as we are unable to locate her pregnancy today.  She currently does not have any pain.  Her nausea was treated with pyridoxine with complete resolution of her nausea.  She is able to tolerate p.o.  She requested a prescription for the pyridoxine which was provided for her.  We discussed return precautions and the importance of close outpatient follow-up.  Patient understands and agrees with plan.  She was discharged in stable condition.  Patient's presentation is most consistent with acute presentation with potential threat to life or bodily function.   FINAL CLINICAL IMPRESSION(S) / ED DIAGNOSES   Final diagnoses:  Nausea/vomiting in pregnancy     Rx / DC Orders   ED Discharge Orders          Ordered    pyridOXINE (VITAMIN B6) 25 MG tablet  Daily        04/10/22 1354             Note:  This document was prepared using Dragon voice recognition software and may include unintentional dictation errors.   Jackelyn Hoehn, PA-C 04/10/22 1430    Phineas Semen, MD 04/10/22 [phone]

## 2022-05-08 ENCOUNTER — Encounter: Payer: Self-pay | Admitting: Oncology

## 2022-05-15 ENCOUNTER — Ambulatory Visit: Payer: 59

## 2022-05-15 ENCOUNTER — Telehealth: Payer: Self-pay | Admitting: Obstetrics and Gynecology

## 2022-05-15 NOTE — Telephone Encounter (Signed)
Reached out to pt to reschedule the New OB Nurse Intake appt that was scheduled for May 10 at 2:15.  Left message for pt to call back to reschedule.

## 2022-05-18 ENCOUNTER — Encounter: Payer: Self-pay | Admitting: Emergency Medicine

## 2022-05-18 ENCOUNTER — Emergency Department
Admission: EM | Admit: 2022-05-18 | Discharge: 2022-05-18 | Discharge status: Against Medical Advice | Payer: PRIVATE HEALTH INSURANCE | Source: Home / Self Care

## 2022-05-18 DIAGNOSIS — O4691 Antepartum hemorrhage, unspecified, first trimester: Secondary | ICD-10-CM | POA: Insufficient documentation

## 2022-05-18 DIAGNOSIS — Z3A10 10 weeks gestation of pregnancy: Secondary | ICD-10-CM | POA: Diagnosis not present

## 2022-05-18 DIAGNOSIS — Z5321 Procedure and treatment not carried out due to patient leaving prior to being seen by health care provider: Secondary | ICD-10-CM | POA: Insufficient documentation

## 2022-05-18 DIAGNOSIS — O209 Hemorrhage in early pregnancy, unspecified: Secondary | ICD-10-CM | POA: Diagnosis not present

## 2022-05-18 DIAGNOSIS — O161 Unspecified maternal hypertension, first trimester: Secondary | ICD-10-CM | POA: Diagnosis not present

## 2022-05-18 LAB — CBC WITH DIFFERENTIAL/PLATELET
Abs Immature Granulocytes: 0.04 10*3/uL (ref 0.00–0.07)
Basophils Absolute: 0 10*3/uL (ref 0.0–0.1)
Basophils Relative: 0 %
Eosinophils Absolute: 0.1 10*3/uL (ref 0.0–0.5)
Eosinophils Relative: 1 %
HCT: 43.6 % (ref 36.0–46.0)
Hemoglobin: 14.6 g/dL (ref 12.0–15.0)
Immature Granulocytes: 0 %
Lymphocytes Relative: 21 %
Lymphs Abs: 2.2 10*3/uL (ref 0.7–4.0)
MCH: 29.2 pg (ref 26.0–34.0)
MCHC: 33.5 g/dL (ref 30.0–36.0)
MCV: 87.2 fL (ref 80.0–100.0)
Monocytes Absolute: 0.7 10*3/uL (ref 0.1–1.0)
Monocytes Relative: 7 %
Neutro Abs: 7.1 10*3/uL (ref 1.7–7.7)
Neutrophils Relative %: 71 %
Platelets: 276 10*3/uL (ref 150–400)
RBC: 5 MIL/uL (ref 3.87–5.11)
RDW: 12.7 % (ref 11.5–15.5)
WBC: 10.1 10*3/uL (ref 4.0–10.5)
nRBC: 0 % (ref 0.0–0.2)

## 2022-05-18 LAB — COMPREHENSIVE METABOLIC PANEL
ALT: 51 U/L — ABNORMAL HIGH (ref 0–44)
AST: 34 U/L (ref 15–41)
Albumin: 4.4 g/dL (ref 3.5–5.0)
Alkaline Phosphatase: 63 U/L (ref 38–126)
Anion gap: 11 (ref 5–15)
BUN: 9 mg/dL (ref 6–20)
CO2: 22 mmol/L (ref 22–32)
Calcium: 9.3 mg/dL (ref 8.9–10.3)
Chloride: 101 mmol/L (ref 98–111)
Creatinine, Ser: 0.68 mg/dL (ref 0.44–1.00)
GFR, Estimated: >60 mL/min (ref >60)
Glucose, Bld: 98 mg/dL (ref 70–99)
Potassium: 3.2 mmol/L — ABNORMAL LOW (ref 3.5–5.1)
Sodium: 134 mmol/L — ABNORMAL LOW (ref 135–145)
Total Bilirubin: 0.5 mg/dL (ref 0.3–1.2)
Total Protein: 8.1 g/dL (ref 6.5–8.1)

## 2022-05-18 LAB — ABO/RH: ABO/RH(D): O POS

## 2022-05-18 NOTE — ED Notes (Signed)
Pt a hard stick. This tech obtained light green top, lavender top and pink top and sent to lab. Called lab and spoke with Lamonte- informed him that pt is a hard stick.

## 2022-05-18 NOTE — ED Triage Notes (Signed)
Pt presents ambulatory to triage via POV with complaints of vaginal bleeding - pt is [redacted] weeks pregnant and has has some discomfort and mild clotting when using the restroom today.  A&Ox4 at this time. Denies dizziness, fevers, chills, CP or SOB.

## 2022-05-19 ENCOUNTER — Other Ambulatory Visit: Payer: Self-pay

## 2022-05-19 ENCOUNTER — Emergency Department
Admission: EM | Admit: 2022-05-19 | Discharge: 2022-05-19 | Discharge status: Against Medical Advice | Payer: 59 | Attending: Emergency Medicine | Admitting: Emergency Medicine

## 2022-05-19 ENCOUNTER — Emergency Department: Payer: 59

## 2022-05-19 ENCOUNTER — Emergency Department
Admission: EM | Admit: 2022-05-19 | Discharge: 2022-05-19 | Disposition: A | Discharge status: Home / Self Care | Payer: 59 | Source: Home / Self Care | Attending: Emergency Medicine | Admitting: Emergency Medicine

## 2022-05-19 DIAGNOSIS — O4691 Antepartum hemorrhage, unspecified, first trimester: Secondary | ICD-10-CM | POA: Insufficient documentation

## 2022-05-19 DIAGNOSIS — O161 Unspecified maternal hypertension, first trimester: Secondary | ICD-10-CM | POA: Insufficient documentation

## 2022-05-19 DIAGNOSIS — O209 Hemorrhage in early pregnancy, unspecified: Secondary | ICD-10-CM | POA: Diagnosis not present

## 2022-05-19 DIAGNOSIS — O418X11 Other specified disorders of amniotic fluid and membranes, first trimester, fetus 1: Secondary | ICD-10-CM

## 2022-05-19 DIAGNOSIS — Z3A10 10 weeks gestation of pregnancy: Secondary | ICD-10-CM | POA: Insufficient documentation

## 2022-05-19 LAB — CBC WITH DIFFERENTIAL/PLATELET
Abs Immature Granulocytes: 0.01 10*3/uL (ref 0.00–0.07)
Basophils Absolute: 0 10*3/uL (ref 0.0–0.1)
Basophils Relative: 1 %
Eosinophils Absolute: 0 10*3/uL (ref 0.0–0.5)
Eosinophils Relative: 1 %
HCT: 39.9 % (ref 36.0–46.0)
Hemoglobin: 13.7 g/dL (ref 12.0–15.0)
Immature Granulocytes: 0 %
Lymphocytes Relative: 19 %
Lymphs Abs: 1.3 10*3/uL (ref 0.7–4.0)
MCH: 29.5 pg (ref 26.0–34.0)
MCHC: 34.3 g/dL (ref 30.0–36.0)
MCV: 86 fL (ref 80.0–100.0)
Monocytes Absolute: 0.4 10*3/uL (ref 0.1–1.0)
Monocytes Relative: 6 %
Neutro Abs: 4.9 10*3/uL (ref 1.7–7.7)
Neutrophils Relative %: 73 %
Platelets: 267 10*3/uL (ref 150–400)
RBC: 4.64 MIL/uL (ref 3.87–5.11)
RDW: 12.8 % (ref 11.5–15.5)
WBC: 6.6 10*3/uL (ref 4.0–10.5)
nRBC: 0 % (ref 0.0–0.2)

## 2022-05-19 LAB — URINALYSIS, ROUTINE W REFLEX MICROSCOPIC
Bilirubin Urine: NEGATIVE
Glucose, UA: NEGATIVE mg/dL
Leukocytes,Ua: NEGATIVE
Nitrite: NEGATIVE
Protein, ur: 100 mg/dL — AB
Specific Gravity, Urine: >1.03 — ABNORMAL HIGH (ref 1.005–1.030)
pH: 6 (ref 5.0–8.0)

## 2022-05-19 LAB — POC URINE PREG, ED: Preg Test, Ur: POSITIVE — AB

## 2022-05-19 LAB — HCG, QUANTITATIVE, PREGNANCY
hCG, Beta Chain, Quant, S: >250000 m[IU]/mL — ABNORMAL HIGH (ref <5)
hCG, Beta Chain, Quant, S: 219157 m[IU]/mL — ABNORMAL HIGH (ref <5)

## 2022-05-19 NOTE — ED Notes (Signed)
No answer when called several times from lobby 

## 2022-05-19 NOTE — ED Triage Notes (Signed)
Pt here for vaginal bleeding at [redacted] weeks pregnant. Pt states seen last night and left due to wait time. Pt states blood work was drawn but she didn't stay for results.

## 2022-05-19 NOTE — ED Notes (Signed)
Lab contacted by this RN regarding the patients hCG level. Per lab, they "just saw it" and they are going to run the value at this time.

## 2022-05-19 NOTE — Discharge Instructions (Signed)
You have a moderate to large subchorionic hematoma.  This is the reason for your bleeding.  It is very important that you follow-up with OB/GYN this week.  Do not do any strenuous activity.  Please return for any new, worsening, or change in symptoms or other concerns.  It was a pleasure caring for you today.

## 2022-05-19 NOTE — ED Provider Notes (Signed)
[facility] Provider Note    Event Date/Time   First MD Initiated Contact with Patient 05/19/22 [phone]     (approximate)   History   Vaginal Bleeding   HPI  Mary Wall is a 32 y.o. female G3 P2 currently approximately [redacted] weeks pregnant who presents today for evaluation of vaginal bleeding.  Patient reports that she first noticed this last night.  She reports that she noticed it on her inner thighs while she was at work.  She reports that when she got home she put on a pad and came to the emergency department for evaluation, however left due to the long wait times.  She returned this morning reports that she has only scant vaginal spotting.  She denies abdominal pain or back pain.  Patient Active Problem List   Diagnosis Date Noted   Overweight with body mass index (BMI) 25.0-29.9 06/23/2019   Nausea 06/23/2019   Hematuria 06/23/2019   Left upper quadrant abdominal pain- with palpation only  06/23/2019   Gastroesophageal reflux disease without esophagitis 06/23/2019   Encounter for surveillance of contraceptives 06/23/2019   Mass of right breast 10/22/2017   Anemia 06/13/2014   Nexplanon insertion 05/04/2014          Physical Exam   Triage Vital Signs: ED Triage Vitals  Enc Vitals Group     BP 05/19/22 0837 (!) 134/97     Pulse Rate 05/19/22 0837 84     Resp 05/19/22 0837 17     Temp 05/19/22 0837 98 F (36.7 C)     Temp Source 05/19/22 0837 Oral     SpO2 05/19/22 0837 100 %     Weight --      Height --      Head Circumference --      Peak Flow --      Pain Score 05/19/22 0835 5     Pain Loc --      Pain Edu? --      Excl. in GC? --     Most recent vital signs: Vitals:   05/19/22 0837  BP: (!) 134/97  Pulse: 84  Resp: 17  Temp: 98 F (36.7 C)  SpO2: 100%    Physical Exam Vitals and nursing note reviewed.  Constitutional:      General: Awake and alert. No acute distress.    Appearance: Normal appearance. The patient is  normal weight.  HENT:     Head: Normocephalic and atraumatic.     Mouth: Mucous membranes are moist.  Eyes:     General: PERRL. Normal EOMs        Right eye: No discharge.        Left eye: No discharge.     Conjunctiva/sclera: Conjunctivae normal.  Cardiovascular:     Rate and Rhythm: Normal rate and regular rhythm.     Pulses: Normal pulses.     Heart sounds: Normal heart sounds Pulmonary:     Effort: Pulmonary effort is normal. No respiratory distress.     Breath sounds: Normal breath sounds.  Abdominal:     Abdomen is soft. There is no abdominal tenderness. No rebound or guarding. No distention. Musculoskeletal:        General: No swelling. Normal range of motion.     Cervical back: Normal range of motion and neck supple.  Skin:    General: Skin is warm and dry.     Capillary Refill: Capillary refill takes less than 2 seconds.  Findings: No rash.  Neurological:     Mental Status: The patient is awake and alert.      ED Results / Procedures / Treatments   Labs (all labs ordered are listed, but only abnormal results are displayed) Labs Reviewed  HCG, QUANTITATIVE, PREGNANCY - Abnormal; Notable for the following components:      Result Value   hCG, Beta Chain, Quant, S 219,157 (*)    All other components within normal limits  URINALYSIS, ROUTINE W REFLEX MICROSCOPIC - Abnormal; Notable for the following components:   Color, Urine AMBER (*)    APPearance CLEAR (*)    Specific Gravity, Urine >1.030 (*)    Hgb urine dipstick LARGE (*)    Ketones, ur TRACE (*)    Protein, ur 100 (*)    Bacteria, UA MANY (*)    All other components within normal limits  POC URINE PREG, ED - Abnormal; Notable for the following components:   Preg Test, Ur Positive (*)    All other components within normal limits  CBC WITH DIFFERENTIAL/PLATELET     EKG     RADIOLOGY I independently reviewed and interpreted imaging and agree with radiologists findings.      PROCEDURES:  Critical Care performed:   Procedures   MEDICATIONS ORDERED IN ED: Medications - No data to display   IMPRESSION / MDM / ASSESSMENT AND PLAN / ED COURSE  I reviewed the triage vital signs and the nursing notes.   Differential diagnosis includes, but is not limited to, subchorionic hemorrhage, threatened abortion, inevitable abortion, placental abruption, placenta previa.  I reviewed the patient's chart.  Patient came to the emergency department last night with the same complaint.  Patient had blood drawn at that time and had a stable H&H.  She had a hCG greater than 250,000.  She is Rh+.  No ultrasound was obtained.  No urinalysis obtained.  She had normal platelets and LFTs.  Patient is awake and alert, hemodynamically stable and afebrile.  I repeated her hCG which is now 219157.  No RhoGAM required given Rh+ obtained last night.  Ultrasound obtained reveals a subchorionic hemorrhage that is moderate to large measuring 5.1 cm.  I discussed this finding with the patient and recommended very close outpatient follow-up with OB/GYN.  She agrees to call for follow-up this week, already has an appointment for early next week however.  Recommended that she not engage in any exertional activity or heavy lifting.  Patient does have bacteria in her urine, though no leukocytes or nitrites.  She has squamous epithelial cells present, therefore this may be contaminant, will send for culture.  She was also found to have protein in urine, and she is hypertensive.  However she is still early in pregnancy.  Discussed importance of very close outpatient follow-up with OB/GYN for this reason as well.  We also discussed strict return precautions to the emergency department including (but not limited to) heavier bleeding or abdominal pain or headache/visual changes.  Patient understands and agrees with plan.  She was discharged in stable condition.   Patient's presentation is most consistent  with acute complicated illness / injury requiring diagnostic workup.      FINAL CLINICAL IMPRESSION(S) / ED DIAGNOSES   Final diagnoses:  Subchorionic hematoma in first trimester, fetus 1 of multiple gestation  Elevated blood pressure affecting pregnancy in first trimester, antepartum     Rx / DC Orders   ED Discharge Orders     None  Note:  This document was prepared using Dragon voice recognition software and may include unintentional dictation errors.   Keturah Shavers 05/19/22 1230    Jene Every, MD 05/19/22 1258

## 2022-05-21 ENCOUNTER — Encounter: Payer: Self-pay | Admitting: Oncology

## 2022-05-21 NOTE — Telephone Encounter (Signed)
The patient is scheduled for 6/4 for NOB with Dr. Logan Bores.

## 2022-05-26 DIAGNOSIS — Z3490 Encounter for supervision of normal pregnancy, unspecified, unspecified trimester: Secondary | ICD-10-CM | POA: Insufficient documentation

## 2022-05-26 DIAGNOSIS — O0993 Supervision of high risk pregnancy, unspecified, third trimester: Secondary | ICD-10-CM | POA: Insufficient documentation

## 2022-06-02 ENCOUNTER — Telehealth: Payer: Self-pay | Admitting: Obstetrics and Gynecology

## 2022-06-02 NOTE — Telephone Encounter (Signed)
The patient has been scheduled for NOB intake, dating scan, labs for 5/31 and NOB phys. The patient no showed NOB intake. I was contacting the patient about rescheduling for NOB intake. I left generic, message for the patient to give [place] a call back.

## 2022-06-05 ENCOUNTER — Other Ambulatory Visit: Payer: 59

## 2022-06-05 NOTE — Telephone Encounter (Addendum)
I contacted this patient via phone. I left message that our ultrasound tech is out of office, we will need to reschedule. I also let her know I will attempt to rescheduled and reach out to confirm new date and time for imaging appointment. The patient hasn't called back to rescheduled NOB intake.

## 2022-06-09 ENCOUNTER — Encounter: Payer: 59 | Admitting: Obstetrics and Gynecology

## 2022-06-09 DIAGNOSIS — Z3481 Encounter for supervision of other normal pregnancy, first trimester: Secondary | ICD-10-CM

## 2022-06-09 DIAGNOSIS — Z3A12 12 weeks gestation of pregnancy: Secondary | ICD-10-CM

## 2022-06-09 DIAGNOSIS — Z124 Encounter for screening for malignant neoplasm of cervix: Secondary | ICD-10-CM

## 2022-09-29 ENCOUNTER — Other Ambulatory Visit: Payer: Self-pay | Admitting: Obstetrics

## 2022-09-29 DIAGNOSIS — Z3490 Encounter for supervision of normal pregnancy, unspecified, unspecified trimester: Secondary | ICD-10-CM

## 2022-09-29 NOTE — Progress Notes (Signed)
[id number] at [redacted]w[redacted]d, based on early [place] at [redacted]w[redacted]d.  Scheduled for Elective at term induction of labor on 12/12/22 @ 0001.   Prenatal provider: [facility] Pregnancy complicated by: H/x of vacuum and forceps deliveries Migraines Abnormal pap Marginal cord insertion  Prenatal Labs: Blood type/Rh O POS Performed at [facility], [address] [place], [place] [address]   Antibody screen neg  Rubella immune    Varicella Immune  RPR NR  HBsAg   NR  Hep C NR  HIV   NR  GC neg  Chlamydia neg  Genetic screening cfDNA negative  1 hour GTT pending  3 hour GTT pending  GBS   pending   Tdap: 09/29/22 Flu: 09/29/22 Contraception: Nexplanon Feeding preference: breast and formula feeding  ____ Chari Manning, CNM Certified Nurse Midwife [facility]

## 2022-10-29 ENCOUNTER — Other Ambulatory Visit: Payer: Self-pay

## 2022-10-29 ENCOUNTER — Observation Stay
Admission: EM | Admit: 2022-10-29 | Discharge: 2022-10-29 | Disposition: A | Discharge status: Home / Self Care | Payer: 59 | Attending: Obstetrics and Gynecology | Admitting: Obstetrics and Gynecology

## 2022-10-29 ENCOUNTER — Encounter: Payer: Self-pay | Admitting: Obstetrics and Gynecology

## 2022-10-29 DIAGNOSIS — F17290 Nicotine dependence, other tobacco product, uncomplicated: Secondary | ICD-10-CM | POA: Insufficient documentation

## 2022-10-29 DIAGNOSIS — R110 Nausea: Secondary | ICD-10-CM

## 2022-10-29 DIAGNOSIS — Z1152 Encounter for screening for COVID-19: Secondary | ICD-10-CM | POA: Diagnosis not present

## 2022-10-29 DIAGNOSIS — Z3A32 32 weeks gestation of pregnancy: Secondary | ICD-10-CM | POA: Diagnosis not present

## 2022-10-29 DIAGNOSIS — O99333 Smoking (tobacco) complicating pregnancy, third trimester: Secondary | ICD-10-CM | POA: Insufficient documentation

## 2022-10-29 DIAGNOSIS — O212 Late vomiting of pregnancy: Secondary | ICD-10-CM | POA: Diagnosis present

## 2022-10-29 DIAGNOSIS — O219 Vomiting of pregnancy, unspecified: Principal | ICD-10-CM | POA: Diagnosis present

## 2022-10-29 LAB — CBC
HCT: 30.3 % — ABNORMAL LOW (ref 36.0–46.0)
Hemoglobin: 10.2 g/dL — ABNORMAL LOW (ref 12.0–15.0)
MCH: 28.7 pg (ref 26.0–34.0)
MCHC: 33.7 g/dL (ref 30.0–36.0)
MCV: 85.1 fL (ref 80.0–100.0)
Platelets: 259 10*3/uL (ref 150–400)
RBC: 3.56 MIL/uL — ABNORMAL LOW (ref 3.87–5.11)
RDW: 13 % (ref 11.5–15.5)
WBC: 9.9 10*3/uL (ref 4.0–10.5)
nRBC: 0 % (ref 0.0–0.2)

## 2022-10-29 LAB — COMPREHENSIVE METABOLIC PANEL
ALT: 20 U/L (ref 0–44)
AST: 21 U/L (ref 15–41)
Albumin: 3 g/dL — ABNORMAL LOW (ref 3.5–5.0)
Alkaline Phosphatase: 122 U/L (ref 38–126)
Anion gap: 10 (ref 5–15)
BUN: 6 mg/dL (ref 6–20)
CO2: 20 mmol/L — ABNORMAL LOW (ref 22–32)
Calcium: 8.4 mg/dL — ABNORMAL LOW (ref 8.9–10.3)
Chloride: 101 mmol/L (ref 98–111)
Creatinine, Ser: 0.56 mg/dL (ref 0.44–1.00)
GFR, Estimated: >60 mL/min (ref >60)
Glucose, Bld: 93 mg/dL (ref 70–99)
Potassium: 3.3 mmol/L — ABNORMAL LOW (ref 3.5–5.1)
Sodium: 131 mmol/L — ABNORMAL LOW (ref 135–145)
Total Bilirubin: 1 mg/dL (ref 0.3–1.2)
Total Protein: 7 g/dL (ref 6.5–8.1)

## 2022-10-29 LAB — URINALYSIS, COMPLETE (UACMP) WITH MICROSCOPIC
Bilirubin Urine: NEGATIVE
Glucose, UA: NEGATIVE mg/dL
Hgb urine dipstick: NEGATIVE
Ketones, ur: 80 mg/dL — AB
Leukocytes,Ua: NEGATIVE
Nitrite: NEGATIVE
Protein, ur: 100 mg/dL — AB
Specific Gravity, Urine: 1.025 (ref 1.005–1.030)
pH: 5 (ref 5.0–8.0)

## 2022-10-29 LAB — RESP PANEL BY RT-PCR (RSV, FLU A&B, COVID)  RVPGX2
Influenza A by PCR: NEGATIVE
Influenza B by PCR: NEGATIVE
Resp Syncytial Virus by PCR: NEGATIVE
SARS Coronavirus 2 by RT PCR: NEGATIVE

## 2022-10-29 LAB — LIPASE, BLOOD: Lipase: 24 U/L (ref 11–51)

## 2022-10-29 MED ORDER — ONDANSETRON HCL 4 MG/2ML IJ SOLN
4.0000 mg | Freq: Four times a day (QID) | INTRAMUSCULAR | Status: DC
  Administered 2022-10-29: 4 mg via INTRAVENOUS

## 2022-10-29 MED ORDER — ONDANSETRON 4 MG PO TBDP: 4.0000 mg | ORAL_TABLET | Freq: Three times a day (TID) | ORAL | 1 refills | Status: DC | PRN

## 2022-10-29 MED ORDER — METOCLOPRAMIDE HCL 5 MG/ML IJ SOLN
10.0000 mg | Freq: Four times a day (QID) | INTRAMUSCULAR | Status: DC
  Administered 2022-10-29: 10 mg via INTRAVENOUS
  Filled 2022-10-29: qty 2

## 2022-10-29 MED ORDER — KCL IN DEXTROSE-NACL 20-5-0.45 MEQ/L-%-% IV SOLN
INTRAVENOUS | Status: DC
  Filled 2022-10-29: qty 1000

## 2022-10-29 MED ORDER — LACTATED RINGERS IV BOLUS
1000.0000 mL | Freq: Once | INTRAVENOUS | Status: AC
  Administered 2022-10-29: 1000 mL via INTRAVENOUS

## 2022-10-29 MED ORDER — ACETAMINOPHEN 500 MG PO TABS: 1000.0000 mg | ORAL_TABLET | Freq: Four times a day (QID) | ORAL | Status: DC | PRN

## 2022-10-29 MED ORDER — METOCLOPRAMIDE HCL 5 MG PO TABS: 5.0000 mg | ORAL_TABLET | Freq: Four times a day (QID) | ORAL | 1 refills | Status: DC | PRN

## 2022-10-29 MED ORDER — CALCIUM CARBONATE ANTACID 500 MG PO CHEW: 2.0000 | CHEWABLE_TABLET | ORAL | Status: DC | PRN

## 2022-10-29 MED ORDER — ONDANSETRON HCL 4 MG/2ML IJ SOLN
INTRAMUSCULAR | Status: AC
  Filled 2022-10-29: qty 2

## 2022-10-29 NOTE — Progress Notes (Signed)
Discharge instructions provided to patient. Patient verbalized understanding. Pt educated on signs and symptoms of labor, vaginal bleeding, LOF, and fetal movement. Red flag signs reviewed by RN. Patient discharged home in stable condition. 

## 2022-10-29 NOTE — OB Triage Note (Signed)
Patient is a 32 yo, G4P2, at 32 weeks 6 days. Patient presents with complaints of nausea and vomiting that started around 1200 yesterday after her ROB visit. Patient states she has vomited 10+ times in the last 24hrs and has been unable to tolerate any food or water during that time. Patient denies any vaginal bleeding or LOF. Patient reports occasional "lightning crotch" sensation. Patient reports FM has been decreased since the vomiting started. Monitors applied and assessing. VSS. Initial fetal heart tone 145. Mackie CNM notified of patients arrival to unit. Plan to place in observation for IV rehydration, zofran, labs, and fetal monitoring.

## 2022-10-29 NOTE — Discharge Summary (Signed)
Mary Wall is a 32 y.o. female. She is at [redacted]w[redacted]d gestation. No LMP recorded. Patient is pregnant. 12/18/2022, Date entered prior to episode creation   Prenatal care site: [facility]  Chief complaint: N/V for past 24 hours   Admission Diagnoses:  1) intrauterine pregnancy at [redacted]w[redacted]d  2) Nausea and vomiting during pregnancy [O21.9]  Discharge Diagnoses:  Principal Problem:   Nausea and vomiting during pregnancy   HPI: Mary Wall presents to L&D with complaints of N/V that started yesterday. She had a routine prenatal appointment yesterday and then started to feel unwell shortly after. She's had >10 episodes of emesis in past 24 hours. States she does not usually have N/V since the first trimester. Denies fever, abdominal pain, diarrhea. She does report malaise and feeling weak since this morning.  Her pregnancy is complicated by marginal cord insertion .  She denies Contractions, Loss of fluid, or Vaginal bleeding. Endorses fetal movement as active.   S: Laying in bed, no CTX, no VB.no LOF,  Active fetal movement.   Maternal Medical History:  Past Medical Hx:  has a past medical history of Anemia, Anxiety (04/30/2020), Blood transfusion without reported diagnosis, Breast disorder, Depression, Gastroesophageal reflux disease without esophagitis (06/23/2019), Migraines, and Ovarian cyst.    Past Surgical Hx:  has a past surgical history that includes Breast biopsy (Right, 2019).   No Known Allergies   Prior to Admission medications   Medication Sig Start Date End Date Taking? Authorizing Provider  metoCLOPramide (REGLAN) 5 MG tablet Take 1 tablet (5 mg total) by mouth every 6 (six) hours as needed for nausea or vomiting. 10/29/22  Yes Gustavo Lah, CNM  amoxicillin-clavulanate (AUGMENTIN) 875-125 MG tablet Take 1 tablet by mouth 2 (two) times daily. 04/08/21   Irean Hong, MD  butalbital-acetaminophen-caffeine (FIORICET WITH CODEINE) [phone] MG capsule Take by mouth.     [provider]  dicyclomine (BENTYL) 20 MG tablet Take 1 tablet (20 mg total) by mouth every 6 (six) hours. 04/08/21   Irean Hong, MD  Multiple Vitamins-Minerals (MULTIVITAMIN) tablet Take 1 tablet by mouth daily. 08/25/19   Matt Holmes, PA  ondansetron (ZOFRAN) 4 MG tablet Take 1 tablet (4 mg total) by mouth every 8 (eight) hours as needed for nausea or vomiting. 06/23/19   Flinchum, Eula Fried, FNP  ondansetron (ZOFRAN-ODT) 4 MG disintegrating tablet Take 1 tablet (4 mg total) by mouth every 8 (eight) hours as needed for nausea or vomiting. 10/29/22   Gustavo Lah, CNM  pantoprazole (PROTONIX) 40 MG tablet Take 1 tablet (40 mg total) by mouth daily. 04/08/21   Irean Hong, MD  pyridOXINE (VITAMIN B6) 25 MG tablet Take 1 tablet (25 mg total) by mouth daily. 04/10/22   Poggi, Herb Grays, PA-C  Vitamin D, Ergocalciferol, (DRISDOL) 1.25 MG (50000 UNIT) CAPS capsule Take 1 capsule (50,000 Units total) by mouth every 7 (seven) days. 06/30/19   Flinchum, Eula Fried, FNP    Social History: She  reports that she has been smoking e-cigarettes. She has never used smokeless tobacco. She reports current alcohol use. She reports current drug use. Drug: Marijuana.  Family History: family history includes Bone cancer in her maternal grandfather; Cancer in her maternal grandfather; Diabetes in her maternal grandmother, mother, and sister; Heart attack in her maternal grandmother; Hypertension in her maternal grandfather, maternal grandmother, mother, and sister; Migraines in her sister; Seizures in her maternal grandmother.   Review of Systems: A full review of systems was  performed and negative except as noted in the HPI.     Pertinent Results:   O:  BP 129/89 (BP Location: Left Arm)   Pulse 99   Temp 98.3 F (36.8 C) (Oral)   Resp 18   Ht 5\' 1"  (1.549 m)   Wt 75.8 kg   BMI 31.55 kg/m  Results for orders placed or performed during the hospital encounter of 10/29/22 (from the past 48 hour(s))   Resp panel by RT-PCR (RSV, Flu A&B, Covid) Urine, Clean Catch   Collection Time: 10/29/22  9:41 AM   Specimen: Urine, Clean Catch; Nasal Swab  Result Value Ref Range   SARS Coronavirus 2 by RT PCR NEGATIVE NEGATIVE   Influenza A by PCR NEGATIVE NEGATIVE   Influenza B by PCR NEGATIVE NEGATIVE   Resp Syncytial Virus by PCR NEGATIVE NEGATIVE  Urinalysis, Complete w Microscopic -Urine, Clean Catch   Collection Time: 10/29/22  9:41 AM  Result Value Ref Range   Color, Urine AMBER (A) YELLOW   APPearance CLOUDY (A) CLEAR   Specific Gravity, Urine 1.025 1.005 - 1.030   pH 5.0 5.0 - 8.0   Glucose, UA NEGATIVE NEGATIVE mg/dL   Hgb urine dipstick NEGATIVE NEGATIVE   Bilirubin Urine NEGATIVE NEGATIVE   Ketones, ur 80 (A) NEGATIVE mg/dL   Protein, ur 782 (A) NEGATIVE mg/dL   Nitrite NEGATIVE NEGATIVE   Leukocytes,Ua NEGATIVE NEGATIVE   RBC / HPF 0-5 0 - 5 RBC/hpf   WBC, UA 6-10 0 - 5 WBC/hpf   Bacteria, UA FEW (A) NONE SEEN   Squamous Epithelial / HPF 21-50 0 - 5 /HPF   Mucus PRESENT   CBC   Collection Time: 10/29/22  9:41 AM  Result Value Ref Range   WBC 9.9 4.0 - 10.5 K/uL   RBC 3.56 (L) 3.87 - 5.11 MIL/uL   Hemoglobin 10.2 (L) 12.0 - 15.0 g/dL   HCT 95.6 (L) 21.3 - 08.6 %   MCV 85.1 80.0 - 100.0 fL   MCH 28.7 26.0 - 34.0 pg   MCHC 33.7 30.0 - 36.0 g/dL   RDW 57.8 46.9 - 62.9 %   Platelets 259 150 - 400 K/uL   nRBC 0.0 0.0 - 0.2 %  Comprehensive metabolic panel   Collection Time: 10/29/22  9:41 AM  Result Value Ref Range   Sodium 131 (L) 135 - 145 mmol/L   Potassium 3.3 (L) 3.5 - 5.1 mmol/L   Chloride 101 98 - 111 mmol/L   CO2 20 (L) 22 - 32 mmol/L   Glucose, Bld 93 70 - 99 mg/dL   BUN 6 6 - 20 mg/dL   Creatinine, Ser 5.28 0.44 - 1.00 mg/dL   Calcium 8.4 (L) 8.9 - 10.3 mg/dL   Total Protein 7.0 6.5 - 8.1 g/dL   Albumin 3.0 (L) 3.5 - 5.0 g/dL   AST 21 15 - 41 U/L   ALT 20 0 - 44 U/L   Alkaline Phosphatase 122 38 - 126 U/L   Total Bilirubin 1.0 0.3 - 1.2 mg/dL   GFR,  Estimated >41 >32 mL/min   Anion gap 10 5 - 15  Lipase, blood   Collection Time: 10/29/22  9:41 AM  Result Value Ref Range   Lipase 24 11 - 51 U/L     Constitutional: NAD, AAOx3  PULM: nl respiratory effort Abd: gravid, non-tender Ext: Non-tender, Nonedmeatous Psych: mood appropriate, speech normal Pelvic : deferred   NST: Baseline FHR: 140 beats/min Variability: moderate Accelerations: present Decelerations: absent  Tocometry: None Time: at least 20 minutes   Interpretation: Category I INDICATIONS: rule out uterine contractions RESULTS:  A NST procedure was performed with FHR monitoring and a normal baseline established, appropriate time of 20-40 minutes of evaluation, and accels >2 seen w 15x15 characteristics.  Results show a REACTIVE NST.   Consults: None  Procedures: NST, IV fluids   Hospital Course: The patient was admitted to Labor and Delivery Triage for observation. She was given IV hydration and IV antiemetics. Her symptoms improved after IV fluids and medications. She was able to tolerate clear liquids and bland diet prior to discharge. Rx sent to pharmacy for oral antiemetics for home.  She was deemed stable for discharge and further outpatient management.   Plan: 1) Reactive NST  -Category 1 tracing  -Reassuring fetal status   2) N/V in pregnancy  -Discussed warning signs to return to L&D triage with  -Reviewed comfort measures  -Instructed to advance diet slowly, as tolerated  -Rx for zofran and reglan sent to pharmacy   Discharge Condition: stable  Disposition: Discharge disposition: 01-Home or Self Care      Allergies as of 10/29/2022   No Known Allergies      Medication List     STOP taking these medications    ondansetron 4 MG tablet Commonly known as: Zofran       TAKE these medications    amoxicillin-clavulanate 875-125 MG tablet Commonly known as: Augmentin Take 1 tablet by mouth 2 (two) times daily.    butalbital-apap-caffeine-codeine 50-325-40-30 MG capsule Commonly known as: FIORICET WITH CODEINE Take by mouth.   dicyclomine 20 MG tablet Commonly known as: BENTYL Take 1 tablet (20 mg total) by mouth every 6 (six) hours.   metoCLOPramide 5 MG tablet Commonly known as: Reglan Take 1 tablet (5 mg total) by mouth every 6 (six) hours as needed for nausea or vomiting.   multivitamin tablet Take 1 tablet by mouth daily.   ondansetron 4 MG disintegrating tablet Commonly known as: ZOFRAN-ODT Take 1 tablet (4 mg total) by mouth every 8 (eight) hours as needed for nausea or vomiting.   pantoprazole 40 MG tablet Commonly known as: PROTONIX Take 1 tablet (40 mg total) by mouth daily.   pyridOXINE 25 MG tablet Commonly known as: VITAMIN B6 Take 1 tablet (25 mg total) by mouth daily.   Vitamin D (Ergocalciferol) 1.25 MG (50000 UNIT) Caps capsule Commonly known as: DRISDOL Take 1 capsule (50,000 Units total) by mouth every 7 (seven) days.        Follow-up Information     [facility] Follow up in 2 week(s).   Why: routine prenatal appointment Contact information: [address] [place] [address] [phone]               ----- Gustavo Lah, CNM  Certified Nurse Midwife [facility]

## 2022-11-11 ENCOUNTER — Observation Stay
Admission: EM | Admit: 2022-11-11 | Discharge: 2022-11-12 | Disposition: A | Discharge status: Home / Self Care | Payer: 59 | Attending: Obstetrics and Gynecology | Admitting: Obstetrics and Gynecology

## 2022-11-11 ENCOUNTER — Encounter: Payer: Self-pay | Admitting: Obstetrics and Gynecology

## 2022-11-11 ENCOUNTER — Other Ambulatory Visit: Payer: Self-pay

## 2022-11-11 ENCOUNTER — Observation Stay: Payer: 59

## 2022-11-11 ENCOUNTER — Observation Stay (HOSPITAL_BASED_OUTPATIENT_CLINIC_OR_DEPARTMENT_OTHER): Payer: 59

## 2022-11-11 DIAGNOSIS — O288 Other abnormal findings on antenatal screening of mother: Secondary | ICD-10-CM | POA: Diagnosis present

## 2022-11-11 DIAGNOSIS — O43193 Other malformation of placenta, third trimester: Secondary | ICD-10-CM

## 2022-11-11 DIAGNOSIS — D649 Anemia, unspecified: Secondary | ICD-10-CM | POA: Diagnosis present

## 2022-11-11 DIAGNOSIS — O365930 Maternal care for other known or suspected poor fetal growth, third trimester, not applicable or unspecified: Secondary | ICD-10-CM

## 2022-11-11 DIAGNOSIS — O26893 Other specified pregnancy related conditions, third trimester: Secondary | ICD-10-CM | POA: Insufficient documentation

## 2022-11-11 DIAGNOSIS — Z3A34 34 weeks gestation of pregnancy: Secondary | ICD-10-CM

## 2022-11-11 DIAGNOSIS — R112 Nausea with vomiting, unspecified: Secondary | ICD-10-CM | POA: Insufficient documentation

## 2022-11-11 DIAGNOSIS — O99013 Anemia complicating pregnancy, third trimester: Secondary | ICD-10-CM | POA: Insufficient documentation

## 2022-11-11 DIAGNOSIS — O99019 Anemia complicating pregnancy, unspecified trimester: Secondary | ICD-10-CM | POA: Diagnosis present

## 2022-11-11 MED ORDER — ONDANSETRON 4 MG PO TBDP
4.0000 mg | ORAL_TABLET | Freq: Three times a day (TID) | ORAL | Status: DC | PRN
  Administered 2022-11-11: 4 mg via ORAL
  Filled 2022-11-11: qty 1

## 2022-11-11 MED ORDER — ACETAMINOPHEN 500 MG PO TABS: 1000.0000 mg | ORAL_TABLET | Freq: Four times a day (QID) | ORAL | Status: DC | PRN

## 2022-11-11 MED ORDER — CALCIUM CARBONATE ANTACID 500 MG PO CHEW: 2.0000 | CHEWABLE_TABLET | ORAL | Status: DC | PRN

## 2022-11-11 NOTE — Progress Notes (Signed)
[id number] RN at bedside continuously adjusting external fetal monitor to maintain continuous monitoring. Vigorous fetal movement palpated by RN and felt by Shanda Bumps during this time. Intermittent heart tones in the 130s-140s noted during this period.

## 2022-11-11 NOTE — Plan of Care (Signed)
MFM Plan of Care Discussed this patient with Dr. Jean Rosenthal. The patient was seen in the referring providers office for FGR and the BPP was 4/8.  Her NST was not reactive but it was reassuring, giving a BPP of 4/10. Due to the potential for a false positive test, she was placed on continuous monitoring. She was sent to the MFM office for UA dopplers which were normal.   Plan - Continuous fetal monitoring should be done while hospitalized.  - Repeat BPP 4-6 hours after the initial BPP. - If the BPP is at least 6/10, then she should be kept on continuous fetal monitoring with a repeat BPP in the morning. If it is then 8/10 or higher she can be discharged with outpatient testing.  - If the BPP is 4/10 or lower, then a CST or delivery should be considered due to concern for fetal hypoxia. - If the BPP is 2/10 or lower then delivery should occur promptly.

## 2022-11-11 NOTE — Progress Notes (Addendum)
This RN called to bedside by 2nd RN to assist with fetal monitoring. Upon walking in the room, this RN noted that the fetal monitor was displaying 70s-90s while fetus was audibly tracing in the 130s. Fetal monitor adjusted by this RN and continuous tracing in 130s with moderate variability noted. Charge RN paged Tami Lin CNM to provide update.

## 2022-11-11 NOTE — Progress Notes (Signed)
Patient wheeled back to obs3

## 2022-11-11 NOTE — Progress Notes (Signed)
Patient wheeled down to ultrasound.

## 2022-11-11 NOTE — Progress Notes (Signed)
Patient wheeled down to MFM clinic

## 2022-11-11 NOTE — OB Triage Note (Signed)
Patient is a [id number] at [redacted]w[redacted]d who was sent over from the office for non-reactive NST. Reports +FM, occasional ctx, denies vaginal bleeding and LOF. External monitors applied and assessing. Initial FHT 130. Vital signs WDL.

## 2022-11-11 NOTE — H&P (Cosign Needed Addendum)
HISTORY AND PHYSICAL NOTE  History of Present Illness: DETTA MELLIN is a 32 y.o. [id number] at [redacted]w[redacted]d admitted for nonreactive NST.  She presented to L&D after non-reactive NST in office. She had a scheduled growth [place] for marginal cord insertion. Growth was overall in 16% and AC was 9%. AFI was 9.5cm.  NST remained non-reactive in L&D. BPP was completed and was 4/10 (0 for tone and 0 for breathing). Her pregnancy is complicated by marginal cord insertion and possible fetal growth restriction (new concern).  Reports active fetal movement  Contractions: denies  LOF/SROM: denies  Vaginal bleeding: denies  Fetal presentation is cephalic.  Principal Problem:   Non-reactive NST (non-stress test) Active Problems:   Anemia   Marginal insertion of umbilical cord affecting management of mother in third trimester    Prenatal care site:  [facility]  Patient Active Problem List   Diagnosis Date Noted   Non-reactive NST (non-stress test) 11/11/2022   Marginal insertion of umbilical cord affecting management of mother in third trimester 11/11/2022   Nausea and vomiting during pregnancy 10/29/2022   Supervision of normal pregnancy 05/26/2022   Anxiety 04/30/2020   Vitamin D deficiency 04/30/2020   Overweight with body mass index (BMI) 25.0-29.9 06/23/2019   Gastroesophageal reflux disease without esophagitis 06/23/2019   Mass of right breast 10/22/2017   Anemia 06/13/2014    Past Medical History:  Diagnosis Date   Anemia    Anxiety 04/30/2020   Blood transfusion without reported diagnosis    Breast disorder    right breast lump   Depression    Gastroesophageal reflux disease without esophagitis 06/23/2019   Migraines    Ovarian cyst     Past Surgical History:  Procedure Laterality Date   BREAST BIOPSY Right 2019   PASH    OB History  Gravida Para Term Preterm AB Living  4 2 2   1 2   SAB IAB Ectopic Multiple Live Births  1            # Outcome Date GA Lbr Len/2nd  Weight Sex Type Anes PTL Lv  4 Current           3 Term           2 Term           1 SAB             Social History:  reports that she has quit smoking. Her smoking use included e-cigarettes. She has never used smokeless tobacco. She reports current alcohol use. She reports current drug use. Drug: Marijuana.  Family History: family history includes Bone cancer in her maternal grandfather; Cancer in her maternal grandfather; Diabetes in her maternal grandmother, mother, and sister; Heart attack in her maternal grandmother; Hypertension in her maternal grandfather, maternal grandmother, mother, and sister; Migraines in her sister; Seizures in her maternal grandmother.  No Known Allergies  Medications Prior to Admission  Medication Sig Dispense Refill Last Dose   amoxicillin-clavulanate (AUGMENTIN) 875-125 MG tablet Take 1 tablet by mouth 2 (two) times daily. 14 tablet 0    butalbital-acetaminophen-caffeine (FIORICET WITH CODEINE) 50-325-40-30 MG capsule Take by mouth.      dicyclomine (BENTYL) 20 MG tablet Take 1 tablet (20 mg total) by mouth every 6 (six) hours. 20 tablet 0    metoCLOPramide (REGLAN) 5 MG tablet Take 1 tablet (5 mg total) by mouth every 6 (six) hours as needed for nausea or vomiting. 30 tablet 1    Multiple  Vitamins-Minerals (MULTIVITAMIN) tablet Take 1 tablet by mouth daily. 100 tablet 0    ondansetron (ZOFRAN-ODT) 4 MG disintegrating tablet Take 1 tablet (4 mg total) by mouth every 8 (eight) hours as needed for nausea or vomiting. 30 tablet 1    pantoprazole (PROTONIX) 40 MG tablet Take 1 tablet (40 mg total) by mouth daily. 30 tablet 0    pyridOXINE (VITAMIN B6) 25 MG tablet Take 1 tablet (25 mg total) by mouth daily. 14 tablet 0    Vitamin D, Ergocalciferol, (DRISDOL) 1.25 MG (50000 UNIT) CAPS capsule Take 1 capsule (50,000 Units total) by mouth every 7 (seven) days. 12 capsule 0     Review of Systems  Constitutional:  Negative for chills and fever.  Respiratory:   Negative for cough, shortness of breath and wheezing.   Gastrointestinal:  Positive for nausea. Negative for abdominal pain, heartburn and vomiting.  Genitourinary:  Negative for dysuria, frequency and urgency.  Musculoskeletal:  Negative for back pain.  Neurological:  Negative for dizziness, tingling and headaches.  Psychiatric/Behavioral:  Negative for depression. The patient is not nervous/anxious.     Physical Examination: Vitals:  BP 112/64   Pulse 71   Temp 98.4 F (36.9 C) (Oral)   Resp 15   Ht 5\' 1"  (1.549 m)   Wt 73 kg   BMI 30.42 kg/m  General: no acute distress.  HEENT: normocephalic, atraumatic Heart: regular rate & rhythm.  No murmurs/rubs/gallops Lungs: clear to auscultation bilaterally, normal respiratory effort Abdomen: soft, gravid, non-tender;  Pelvic: deferred  Extremities: non-tender, symmetric, no edema bilaterally.  DTRs: 2+/2+  Neurologic: Alert & oriented x 3.    Membranes: intact FHT:  FHR: 130 bpm, variability: moderate,  accelerations:  Present,  decelerations:  Absent; 10x10 accels present Category/reactivity:  Category I, not reactive  UC:   none  Labs:  No results found for this or any previous visit (from the past 24 hour(s)).  Prenatal Labs: Blood type/Rh O pos   Antibody screen neg  Rubella Immune  Varicella Immune  RPR NR  HBsAg Neg  Hep C NR  HIV NR  GC neg  Chlamydia neg  Genetic screening cfDNA negative   1 hour GTT 138  3 hour GTT 73, 115, 104, 108  GBS Unknown     Imaging Studies: US FETAL BPP WO NON STRESS  Result Date: 11/11/2022 CLINICAL DATA:  Marginal insertion of umbilical cord. Nonreactive NST. EXAM: BIOPHYSICAL PROFILE FINDINGS: Number of Fetuses: 1 Heart Rate: 130 bpm Presentation: Cephalic Movement: 2      Time: 30 minutes Breathing: 0 Tone: 0 Amniotic Fluid: 2 Total Score: 4/8 IMPRESSION: Biophysical profile score is 4 out of 8. Critical Value/emergent results were called by telephone at the time of  interpretation on 11/11/2022 at 5:04 pm to Kae Heller, RN, who verbally acknowledged these results. Electronically Signed   By: Agustin Cree M.D.   On: 11/11/2022 17:06   [place] MFM OB LIMITED  Result Date: 11/11/2022 ----------------------------------------------------------------------  OBSTETRICS REPORT                       (Signed Final 11/11/2022 04:08 pm) ---------------------------------------------------------------------- Patient Info  ID #:       [id number]                          D.O.B.:  12-07-1990 (32 yrs)  Name:       Metta Clines  Visit Date: 11/11/2022 03:29 pm ---------------------------------------------------------------------- Performed By  Attending:        Noralee Space MD        Ref. Address:     [facility]  Performed By:     Anabel Halon          [facility]                                     [facility] at                                                             [facility]  Referred By:      Gustavo Lah                    CNM ---------------------------------------------------------------------- Orders  #  Description                           Code        Ordered By  1  [place] MFM OB LIMITED                     76815.01    [facility]  2  [place] MFM UA CORD DOPPLER                76820.02    RA[facility] ----------------------------------------------------------------------  #  Order #                     Accession #                Episode #  1  [id number]                   [id number]                 [id number]  2  [id number]                   [id number]                 [id number] ---------------------------------------------------------------------- Indications  [redacted] weeks gestation of pregnancy                Z3A.34  Maternal care for known or suspected poor      O36.5930  fetal growth, third trimester, single or  unspecified fetus IUGR ---------------------------------------------------------------------- Fetal  Evaluation  Num Of Fetuses:         1  Fetal Heart Rate(bpm):  130  Cardiac Activity:       Observed  Presentation:           Cephalic  Placenta:               Posterior  P. Cord Insertion:      Not well visualized  Amniotic Fluid  AFI FV:      Within normal limits  AFI Sum(cm)     %Tile       Largest Pocket(cm)  10.59           24          4.06  RUQ(cm)       RLQ(cm)       LUQ(cm)        LLQ(cm)  1.37          4.06          2.36           2.8 ---------------------------------------------------------------------- OB History  Gravidity:    4         Term:   2        Prem:   0        SAB:   1  TOP:          0       Ectopic:  0        Living: 2 ---------------------------------------------------------------------- Gestational Age  Clinical EDD:  34w 5d                                        EDD:   12/18/22  Best:          34w 5d     Det. By:  Clinical EDD             EDD:   12/18/22 ---------------------------------------------------------------------- Anatomy  Diaphragm:             Appears normal         Kidneys:                Appear normal  Stomach:               Appears normal, left   Bladder:                Appears normal                         sided ---------------------------------------------------------------------- Doppler - Fetal Vessels  Umbilical Artery   S/D     %tile      RI    %tile      PI    %tile     PSV    ADFV    RDFV                                                     (cm/s)   2.75       66    0.64       74    0.98       76    44.37      No      No ---------------------------------------------------------------------- Impression  G4 P2012@34w  5d gestation.  Patient had biophysical profile  at your office an hour or two ago, and the BPP score was 4/8  (breathing and tone did not meet the criteria).  She had fetal  growth assessment in the estimated fetal weight was at the  16th percentile (4 pounds 13 ounces) and the abdominal  circumference was below the 10th percentile.  Patient is  admitted to  labor and delivery for NST monitoring.  Obstetric history is significant for 2 term vaginal deliveries.  A limited ultrasound study was performed.  Amniotic  fluid is  normal good fetal activity seen.  Breech presentation.  Umbilical artery Doppler showed normal forward diastolic  flow.  BPP was not performed.  I reassured the patient of the findings.  I discussed the findings and recommendations with Margaretmary Eddy, CNM. ---------------------------------------------------------------------- Recommendations  -If NST is reactive, patient may be discharged, and a repeat  biophysical profile performed tomorrow morning. ----------------------------------------------------------------------                 Noralee Space, MD Electronically Signed Final Report   11/11/2022 04:08 pm ----------------------------------------------------------------------   [place] MFM UA CORD DOPPLER  Result Date: 11/11/2022 ----------------------------------------------------------------------  OBSTETRICS REPORT                       (Signed Final 11/11/2022 04:08 pm) ---------------------------------------------------------------------- Patient Info  ID #:       [id number]                          D.O.B.:  1990-03-06 (32 yrs)  Name:       CYRENA KUCHENBECKER                Visit Date: 11/11/2022 03:29 pm ---------------------------------------------------------------------- Performed By  Attending:        Noralee Space MD        Ref. Address:     [facility]  Performed By:     Anabel Halon          [facility]         [facility]                                     Fetal Care at                                                             [facility]  Referred By:      Gustavo Lah                    CNM ---------------------------------------------------------------------- Orders  #  Description                           Code        Ordered By  1  [place] MFM OB LIMITED                     40981.19    [facility]  2   [place] MFM UA CORD DOPPLER                14782.95    RAVI [facility] ----------------------------------------------------------------------  #  Order #                     Accession #                Episode #  1  [id number]                   [id number]  [id number]  2  [id number]                   [id number]                 [id number] ---------------------------------------------------------------------- Indications  [redacted] weeks gestation of pregnancy                Z3A.34  Maternal care for known or suspected poor      O36.5930  fetal growth, third trimester, single or  unspecified fetus IUGR ---------------------------------------------------------------------- Fetal Evaluation  Num Of Fetuses:         1  Fetal Heart Rate(bpm):  130  Cardiac Activity:       Observed  Presentation:           Cephalic  Placenta:               Posterior  P. Cord Insertion:      Not well visualized  Amniotic Fluid  AFI FV:      Within normal limits  AFI Sum(cm)     %Tile       Largest Pocket(cm)  10.59           24          4.06  RUQ(cm)       RLQ(cm)       LUQ(cm)        LLQ(cm)  1.37          4.06          2.36           2.8 ---------------------------------------------------------------------- OB History  Gravidity:    4         Term:   2        Prem:   0        SAB:   1  TOP:          0       Ectopic:  0        Living: 2 ---------------------------------------------------------------------- Gestational Age  Clinical EDD:  34w 5d                                        EDD:   12/18/22  Best:          34w 5d     Det. By:  Clinical EDD             EDD:   12/18/22 ---------------------------------------------------------------------- Anatomy  Diaphragm:             Appears normal         Kidneys:                Appear normal  Stomach:               Appears normal, left   Bladder:                Appears normal                         sided ---------------------------------------------------------------------- Doppler - Fetal Vessels   Umbilical Artery   S/D     %tile      RI    %tile      PI    %tile     PSV    ADFV    RDFV                                                     (  cm/s)   2.75       66    0.64       74    0.98       76    44.37      No      No ---------------------------------------------------------------------- Impression  G4 P2012@34w  5d gestation.  Patient had biophysical profile  at your office an hour or two ago, and the BPP score was 4/8  (breathing and tone did not meet the criteria).  She had fetal  growth assessment in the estimated fetal weight was at the  16th percentile (4 pounds 13 ounces) and the abdominal  circumference was below the 10th percentile.  Patient is  admitted to labor and delivery for NST monitoring.  Obstetric history is significant for 2 term vaginal deliveries.  A limited ultrasound study was performed.  Amniotic fluid is  normal good fetal activity seen.  Breech presentation.  Umbilical artery Doppler showed normal forward diastolic  flow.  BPP was not performed.  I reassured the patient of the findings.  I discussed the findings and recommendations with Margaretmary Eddy, CNM. ---------------------------------------------------------------------- Recommendations  -If NST is reactive, patient may be discharged, and a repeat  biophysical profile performed tomorrow morning. ----------------------------------------------------------------------                 Noralee Space, MD Electronically Signed Final Report   11/11/2022 04:08 pm ----------------------------------------------------------------------    Assessment and Plan: Patient Active Problem List   Diagnosis Date Noted   Non-reactive NST (non-stress test) 11/11/2022   Marginal insertion of umbilical cord affecting management of mother in third trimester 11/11/2022   Nausea and vomiting during pregnancy 10/29/2022   Supervision of normal pregnancy 05/26/2022   Anxiety 04/30/2020   Vitamin D deficiency 04/30/2020   Overweight with body mass  index (BMI) 25.0-29.9 06/23/2019   Gastroesophageal reflux disease without esophagitis 06/23/2019   Mass of right breast 10/22/2017   Anemia 06/13/2014    1. Admission for non-reactive NST  - Admission status: Observation - Dr. Edison Pace MD notified of admission and plan of care   2. Routine antenatal -Routine antenatal care -NICU consult PRN  -Continuous EFM  3. Non-reactive NST -BPP 4/10, 0 points for tone and 0 points for breathing -MFM consult completed with Dr. Thom Chimes  -Recommends continous EFM and repeating BPP in at 4-6 hours -Dr. Judeth Cornfield completed doppler US which were normal  -Recommendations for repeat BPP  -4/10 -> consider CST and possible delivery   -6/10 -> continue to monitor and repeat in morning   -8/10 -> can consider d/c home   Gustavo Lah, CNM  Certified Nurse Midwife [facility]

## 2022-11-12 ENCOUNTER — Observation Stay: Payer: 59

## 2022-11-12 DIAGNOSIS — O288 Other abnormal findings on antenatal screening of mother: Secondary | ICD-10-CM | POA: Diagnosis not present

## 2022-11-12 NOTE — OB Triage Note (Signed)
Discharge instructions, labor precautions, and follow-up care reviewed with patient. All questions answered. Patient verbalized understanding. Discharged ambulatory off unit.   

## 2022-11-12 NOTE — Progress Notes (Signed)
Patient back to obs3 from ultrasound

## 2022-11-12 NOTE — Progress Notes (Signed)
Patient wheeled down to ultrasound.

## 2022-11-12 NOTE — Discharge Summary (Addendum)
Patient ID: Mary Wall MRN: [id number] DOB/AGE: September 23, 1990 32 y.o.  Admit date: 11/11/2022 Discharge date: 11/12/2022  Admission Diagnoses: Mary Wall is a 32 y.o. [id number] at [redacted]w[redacted]d admitted for nonreactive NST.  She presented to L&D after non-reactive NST in office. She had a scheduled growth [place] for marginal cord insertion. Growth was overall in 16% and AC was 9%. AFI was 9.5cm.  NST remained non-reactive in L&D. BPP was completed and was 4/10 (0 for tone and 0 for breathing). Her pregnancy is complicated by marginal cord insertion and possible fetal growth restriction (new concern).   Discharge Diagnoses: MFM was consulted Mary Peek, MD) on 11/11/2022 and a plan of care was developed. MFM plan recommended that a repeat BPP 4-6 hours after initial BPP be performed. If the BPP yielded results of 8/10 or higher, patient was deemed appropriate for discharge with outpatient testing. BPP completed on 11/12/2022 was 8/8.   Factors complicating pregnancy: Non-reactive NST Marginal insertion of umbilical cord affecting management of mother in third trimester Nausea and Vomiting Anxiety Vitamin D Deficiency Overweight with body mass index (BMI) 25.0-29.9 Anemia Gastroesophageal reflux disease without esophagitis   Prenatal Procedures: NST and BPP   Consults:  Maternal Fetal Medicine   Significant Diagnostic Studies:  CLINICAL DATA:  Fetal growth restriction   EXAM: BIOPHYSICAL PROFILE   FINDINGS: Number of Fetuses: 1   Heart Rate: 131 bpm   Presentation: Cephalic   Movement: 2 time: 20 minutes   Breathing: 2   Tone: 2   Amniotic Fluid: 2   Total Score: 8   IMPRESSION: Biophysical profile 8 out of 8     Electronically Signed   By: Elige Ko M.D.   On: 11/12/2022 13:18  Treatments: Zofran 4 mg PO  Hospital Course:  This is a 32 y.o. [id number] with IUP at [redacted]w[redacted]d admitted for nonreactive NST.  She presented to L&D after non-reactive NST in office. She had a  scheduled growth [place] for marginal cord insertion. Growth was overall in 16% and AC was 9%. AFI was 9.5cm.  NST remained non-reactive in L&D. BPP was completed and was 4/10 (0 for tone and 0 for breathing). Patient was kept on continuous fetal monitoring until a repeat BPP could be completed in the morning. Repeat BPP yielded results of 8/8. Fetal heart rate monitoring remained reassuring, and she had no signs/symptoms of progressing preterm labor or other maternal-fetal concerns. She was deemed stable for discharge to home with outpatient follow up.  Discharge Physical Exam:  BP 122/70   Pulse 71   Temp 98.5 F (36.9 C) (Oral)   Resp 14   Ht 5\' 1"  (1.549 m)   Wt 73 kg   SpO2 99%   BMI 30.42 kg/m    Review of Systems  Constitutional: Negative.   Respiratory: Negative.    Cardiovascular: Negative.   Gastrointestinal: Negative.  Negative for abdominal pain.  Genitourinary: Negative.   Psychiatric/Behavioral: Negative.      Physical Exam Constitutional:      Appearance: Normal appearance.  HENT:     Head: Normocephalic.  Cardiovascular:     Rate and Rhythm: Normal rate.  Pulmonary:     Effort: Pulmonary effort is normal.  Abdominal:     General: There is no distension.     Palpations: Abdomen is soft.     Tenderness: There is no abdominal tenderness.     Comments: Gravid, consistent with [redacted]w[redacted]d  Musculoskeletal:        General: Normal  range of motion.  Neurological:     Mental Status: She is alert and oriented to person, place, and time.     Deep Tendon Reflexes: Reflexes are normal and symmetric.  Skin:    General: Skin is warm and dry.  Psychiatric:        Mood and Affect: Mood normal.        Behavior: Behavior normal.        Thought Content: Thought content normal.        Judgment: Judgment normal.     NST: FHR baseline: 130 bpm Variability: moderate Accelerations: yes Decelerations: none Time: approx. 420 minutes Category/reactivity: reactive Biophysical  Profile: BPP Score 8/8 Fetal evaluation comments: monitoring was continuous until repeat BPP was performed and  results were finalized.   TOCO: Occasional  SVE: deferred     Discharge Condition: Stable with outpatient follow-up.   Plan: - Fetal kick count education reviewed with patient and information provided. Patient     verbalized understanding.  - Patient informed of weekly NST. Next NST planned for next week. Patient verbalized    understanding.  - Strict return precautions reviewed with patient.   Disposition: Discharge disposition: 01-Home or Self Care      Discharge Instructions     Discharge patient   Complete by: As directed    Discharge disposition: 01-Home or Self Care   Discharge patient date: 11/12/2022      Allergies as of 11/12/2022   No Known Allergies      Medication List     STOP taking these medications    amoxicillin-clavulanate 875-125 MG tablet Commonly known as: Augmentin   butalbital-apap-caffeine-codeine 50-325-40-30 MG capsule Commonly known as: FIORICET WITH CODEINE       TAKE these medications    dicyclomine 20 MG tablet Commonly known as: BENTYL Take 1 tablet (20 mg total) by mouth every 6 (six) hours.   metoCLOPramide 5 MG tablet Commonly known as: Reglan Take 1 tablet (5 mg total) by mouth every 6 (six) hours as needed for nausea or vomiting.   multivitamin tablet Take 1 tablet by mouth daily.   ondansetron 4 MG disintegrating tablet Commonly known as: ZOFRAN-ODT Take 1 tablet (4 mg total) by mouth every 8 (eight) hours as needed for nausea or vomiting.   pantoprazole 40 MG tablet Commonly known as: PROTONIX Take 1 tablet (40 mg total) by mouth daily.   pyridOXINE 25 MG tablet Commonly known as: VITAMIN B6 Take 1 tablet (25 mg total) by mouth daily.   Vitamin D (Ergocalciferol) 1.25 MG (50000 UNIT) Caps capsule Commonly known as: DRISDOL Take 1 capsule (50,000 Units total) by mouth every 7 (seven) days.          SignedRoney Jaffe, CNM 11/12/2022 2:07 PM

## 2022-11-18 ENCOUNTER — Observation Stay: Payer: 59

## 2022-11-18 ENCOUNTER — Encounter: Payer: Self-pay | Admitting: Obstetrics and Gynecology

## 2022-11-18 ENCOUNTER — Observation Stay
Admission: EM | Admit: 2022-11-18 | Discharge: 2022-11-18 | Disposition: A | Discharge status: Home / Self Care | Payer: 59 | Attending: Obstetrics and Gynecology | Admitting: Obstetrics and Gynecology

## 2022-11-18 ENCOUNTER — Other Ambulatory Visit: Payer: Self-pay

## 2022-11-18 DIAGNOSIS — O365990 Maternal care for other known or suspected poor fetal growth, unspecified trimester, not applicable or unspecified: Secondary | ICD-10-CM

## 2022-11-18 DIAGNOSIS — O09299 Supervision of pregnancy with other poor reproductive or obstetric history, unspecified trimester: Secondary | ICD-10-CM

## 2022-11-18 DIAGNOSIS — O26899 Other specified pregnancy related conditions, unspecified trimester: Secondary | ICD-10-CM

## 2022-11-18 DIAGNOSIS — O368930 Maternal care for other specified fetal problems, third trimester, not applicable or unspecified: Secondary | ICD-10-CM | POA: Diagnosis present

## 2022-11-18 DIAGNOSIS — Z8759 Personal history of other complications of pregnancy, childbirth and the puerperium: Secondary | ICD-10-CM

## 2022-11-18 DIAGNOSIS — Z3A35 35 weeks gestation of pregnancy: Secondary | ICD-10-CM | POA: Diagnosis not present

## 2022-11-18 DIAGNOSIS — Z87891 Personal history of nicotine dependence: Secondary | ICD-10-CM | POA: Insufficient documentation

## 2022-11-18 DIAGNOSIS — O43199 Other malformation of placenta, unspecified trimester: Secondary | ICD-10-CM

## 2022-11-18 DIAGNOSIS — O43193 Other malformation of placenta, third trimester: Principal | ICD-10-CM

## 2022-11-18 DIAGNOSIS — O288 Other abnormal findings on antenatal screening of mother: Secondary | ICD-10-CM | POA: Diagnosis present

## 2022-11-18 DIAGNOSIS — R519 Headache, unspecified: Secondary | ICD-10-CM

## 2022-11-18 MED ORDER — ACETAMINOPHEN 325 MG PO TABS: 650.0000 mg | ORAL_TABLET | ORAL | Status: DC | PRN

## 2022-11-18 MED ORDER — CALCIUM CARBONATE ANTACID 500 MG PO CHEW: 2.0000 | CHEWABLE_TABLET | ORAL | Status: DC | PRN

## 2022-11-18 NOTE — Discharge Summary (Cosign Needed Addendum)
Mary Wall is a 32 y.o. female. She is at [redacted]w[redacted]d gestation. No LMP recorded. Patient is pregnant. Estimated Date of Delivery: 12/18/22  Prenatal care site: [facility]   Current pregnancy complicated by:  Fetal growth restriction Marginal cord insertion History of vacuum and forceps deliveries Migraines Abnormal pap smear  Chief complaint: sent over from the office for nonreactive NST. She was monitored for an NST for 1 hour in the office and only had 10x10 accelerations, no 15x15 accelerations.  She reports good fetal movement. Denies contractions.  S: Resting comfortably. no CTX, no VB.no LOF,  Active fetal movement.  Denies: HA, visual changes, SOB, or RUQ/epigastric pain  Maternal Medical History:   Past Medical History:  Diagnosis Date   Anemia    Anxiety 04/30/2020   Blood transfusion without reported diagnosis    Breast disorder    right breast lump   Depression    Gastroesophageal reflux disease without esophagitis 06/23/2019   Migraines    Ovarian cyst     Past Surgical History:  Procedure Laterality Date   BREAST BIOPSY Right 2019   PASH    No Known Allergies  Prior to Admission medications   Medication Sig Start Date End Date Taking? Authorizing Provider  Multiple Vitamins-Minerals (MULTIVITAMIN) tablet Take 1 tablet by mouth daily. 08/25/19  Yes Matt Holmes, PA  dicyclomine (BENTYL) 20 MG tablet Take 1 tablet (20 mg total) by mouth every 6 (six) hours. 04/08/21   Irean Hong, MD  metoCLOPramide (REGLAN) 5 MG tablet Take 1 tablet (5 mg total) by mouth every 6 (six) hours as needed for nausea or vomiting. 10/29/22   Gustavo Lah, CNM  ondansetron (ZOFRAN-ODT) 4 MG disintegrating tablet Take 1 tablet (4 mg total) by mouth every 8 (eight) hours as needed for nausea or vomiting. 10/29/22   Gustavo Lah, CNM  pantoprazole (PROTONIX) 40 MG tablet Take 1 tablet (40 mg total) by mouth daily. 04/08/21   Irean Hong, MD  pyridOXINE (VITAMIN B6) 25  MG tablet Take 1 tablet (25 mg total) by mouth daily. 04/10/22   Poggi, Herb Grays, PA-C  Vitamin D, Ergocalciferol, (DRISDOL) 1.25 MG (50000 UNIT) CAPS capsule Take 1 capsule (50,000 Units total) by mouth every 7 (seven) days. 06/30/19   Flinchum, Eula Fried, FNP      Social History: She  reports that she has quit smoking. Her smoking use included e-cigarettes. She has never used smokeless tobacco. She reports current alcohol use. She reports current drug use. Drug: Marijuana.  Family History: family history includes Bone cancer in her maternal grandfather; Cancer in her maternal grandfather; Diabetes in her maternal grandmother, mother, and sister; Heart attack in her maternal grandmother; Hypertension in her maternal grandfather, maternal grandmother, mother, and sister; Migraines in her sister; Seizures in her maternal grandmother.  no history of gyn cancers  Review of Systems: A full review of systems was performed and negative except as noted in the HPI.    O:  BP (!) 110/56 (BP Location: Right Arm)   Pulse 75   Temp 98.3 F (36.8 C) (Oral)   Resp 16   Ht 5\' 1"  (1.549 m)   Wt 70.8 kg   BMI 29.48 kg/m  No results found for this or any previous visit (from the past 48 hour(s)).   Constitutional: NAD, AAOx3  HE/ENT: extraocular movements grossly intact, moist mucous membranes CV: RRR PULM: nl respiratory effort, CTABL     Abd: gravid, non-tender, non-distended, soft  Ext: Non-tender, Nonedematous   Psych: mood appropriate, speech normal Pelvic: deferred   Fetal  monitoring: Cat I Appropriate for GA Baseline: 135bpm Variability: moderate with periods of minimal Accelerations: present x >2 Decelerations absent Time 5 hours  A/P: 32 y.o. [redacted]w[redacted]d here for antenatal surveillance for nonreactive NST  Principle Diagnosis:  IUGR, reactive NST, BPP 8/8  Labor: not present.  Fetal Wellbeing: Reassuring Cat 1 tracing over 5 hours. Periods of minimal variability consistent with fetal  sleep cycle, then return to moderate variability. BPP 8/8 and reassuring. Discussed with Dr. Jean Rosenthal and comfortable with discharge. Scheduled for UA dopplers in the office tomorrow for IUGR. D/c home stable, precautions reviewed, follow-up as scheduled.    Mary Wall, CNM 11/18/2022 5:39 PM

## 2022-11-18 NOTE — Progress Notes (Signed)
Discharge instructions provided to patient. Patient verbalized understanding. Pt educated on signs and symptoms of labor, vaginal bleeding, LOF, and fetal movement. Red flag signs reviewed by RN. Patient discharged home in stable condition. 

## 2022-11-18 NOTE — OB Triage Note (Signed)
Patient is a 32 yo, G4P2012, at 35 weeks 5 days. Patient presents to L&D after being sent over from the [facility] office for a nonreactive NST.  Patient denies any vaginal bleeding or LOF. Patient reports +FM. Patient denies any regular or consistent contrations. Monitors applied and assessing. VSS. Initial fetal heart tone 135. Wilson CNM aware of patients arrival to unit. Plan to place in obs for fetal monitoring and BPP.

## 2022-11-20 ENCOUNTER — Telehealth: Payer: Self-pay

## 2022-11-20 ENCOUNTER — Other Ambulatory Visit: Payer: Self-pay | Admitting: Certified Nurse Midwife

## 2022-11-20 DIAGNOSIS — O365930 Maternal care for other known or suspected poor fetal growth, third trimester, not applicable or unspecified: Secondary | ICD-10-CM

## 2022-11-23 ENCOUNTER — Observation Stay
Admission: EM | Admit: 2022-11-23 | Discharge: 2022-11-23 | Disposition: A | Discharge status: Home / Self Care | Payer: 59 | Attending: Obstetrics and Gynecology | Admitting: Obstetrics and Gynecology

## 2022-11-23 ENCOUNTER — Observation Stay: Payer: 59

## 2022-11-23 ENCOUNTER — Other Ambulatory Visit: Payer: Self-pay

## 2022-11-23 ENCOUNTER — Encounter: Payer: Self-pay | Admitting: Obstetrics and Gynecology

## 2022-11-23 DIAGNOSIS — O43193 Other malformation of placenta, third trimester: Principal | ICD-10-CM

## 2022-11-23 DIAGNOSIS — Z3A36 36 weeks gestation of pregnancy: Secondary | ICD-10-CM | POA: Diagnosis not present

## 2022-11-23 DIAGNOSIS — O368330 Maternal care for abnormalities of the fetal heart rate or rhythm, third trimester, not applicable or unspecified: Principal | ICD-10-CM | POA: Insufficient documentation

## 2022-11-23 DIAGNOSIS — Z87891 Personal history of nicotine dependence: Secondary | ICD-10-CM | POA: Diagnosis not present

## 2022-11-23 DIAGNOSIS — Z79899 Other long term (current) drug therapy: Secondary | ICD-10-CM | POA: Insufficient documentation

## 2022-11-23 DIAGNOSIS — O365990 Maternal care for other known or suspected poor fetal growth, unspecified trimester, not applicable or unspecified: Secondary | ICD-10-CM | POA: Diagnosis present

## 2022-11-23 MED ORDER — ACETAMINOPHEN 325 MG PO TABS: 650.0000 mg | ORAL_TABLET | ORAL | Status: DC | PRN

## 2022-11-23 MED ORDER — CALCIUM CARBONATE ANTACID 500 MG PO CHEW: 2.0000 | CHEWABLE_TABLET | ORAL | Status: DC | PRN

## 2022-11-23 NOTE — H&P (Deleted)
Mary Wall is a 32 y.o. female. She is at [redacted]w[redacted]d gestation. No LMP recorded. Patient is pregnant. Estimated Date of Delivery: 12/18/22  Prenatal care site: [facility]    Current pregnancy complicated by:  Fetal growth restriction Marginal cord insertion History of vacuum and forceps deliveries Migraines Abnormal pap smear  Chief complaint: NST for IUGR She has IUGR and has had several non-reactive NSTs in the office requiring prolonged fetal monitoring in L&D. She requested to just do her NSTs in L&D for the duration of the pregnancy.  S: Resting comfortably. no CTX, no VB.no LOF,  Active fetal movement.  Denies: HA, visual changes, SOB, or RUQ/epigastric pain  Maternal Medical History:   Past Medical History:  Diagnosis Date   Anemia    Anxiety 04/30/2020   Blood transfusion without reported diagnosis    Breast disorder    right breast lump   Depression    Gastroesophageal reflux disease without esophagitis 06/23/2019   Migraines    Ovarian cyst     Past Surgical History:  Procedure Laterality Date   BREAST BIOPSY Right 2019   PASH    No Known Allergies  Prior to Admission medications   Medication Sig Start Date End Date Taking? Authorizing Provider  dicyclomine (BENTYL) 20 MG tablet Take 1 tablet (20 mg total) by mouth every 6 (six) hours. 04/08/21  Yes Irean Hong, MD  Multiple Vitamins-Minerals (MULTIVITAMIN) tablet Take 1 tablet by mouth daily. 08/25/19  Yes Matt Holmes, PA  metoCLOPramide (REGLAN) 5 MG tablet Take 1 tablet (5 mg total) by mouth every 6 (six) hours as needed for nausea or vomiting. 10/29/22   Gustavo Lah, CNM  ondansetron (ZOFRAN-ODT) 4 MG disintegrating tablet Take 1 tablet (4 mg total) by mouth every 8 (eight) hours as needed for nausea or vomiting. 10/29/22   Gustavo Lah, CNM  pantoprazole (PROTONIX) 40 MG tablet Take 1 tablet (40 mg total) by mouth daily. 04/08/21   Irean Hong, MD  pyridOXINE (VITAMIN B6) 25 MG tablet  Take 1 tablet (25 mg total) by mouth daily. 04/10/22   Poggi, Herb Grays, PA-C  Vitamin D, Ergocalciferol, (DRISDOL) 1.25 MG (50000 UNIT) CAPS capsule Take 1 capsule (50,000 Units total) by mouth every 7 (seven) days. 06/30/19   Flinchum, Eula Fried, FNP    Social History: She  reports that she has quit smoking. Her smoking use included e-cigarettes. She has never used smokeless tobacco. She reports current alcohol use. She reports current drug use. Drug: Marijuana.  Family History: family history includes Bone cancer in her maternal grandfather; Cancer in her maternal grandfather; Diabetes in her maternal grandmother, mother, and sister; Heart attack in her maternal grandmother; Hypertension in her maternal grandfather, maternal grandmother, mother, and sister; Migraines in her sister; Seizures in her maternal grandmother.  no history of gyn cancers  Review of Systems: A full review of systems was performed and negative except as noted in the HPI.    O:  BP 119/72 (BP Location: Right Arm)   Pulse 88   Temp 98.3 F (36.8 C) (Oral)   Resp 16  No results found for this or any previous visit (from the past 48 hour(s)).   Constitutional: NAD, AAOx3  HE/ENT: extraocular movements grossly intact, moist mucous membranes CV: RRR PULM: nl respiratory effort, CTABL     Abd: gravid, non-tender, non-distended, soft      Ext: Non-tender, Nonedematous   Psych: mood appropriate, speech normal  Fetal  monitoring:  Cat 1 Appropriate for GA Baseline: 140bpm Variability: moderate Accelerations: 2 15x15 accelerations after 2.5hrs of monitoring Decelerations absent Time 2.5hrs  A/P: 32 y.o. [redacted]w[redacted]d here for antenatal surveillance for NST for IUGR  Principle Diagnosis:  Non-reactive NST, IUGR  Dr. Jean Rosenthal updated on assessment and plan of care.  NST: reactive, BPP 10/10. Reassuring fetal status. Prolonged fetal monitoring for 2.5hrs overall reassuring, had two 15x15 accelerations prior to going for her BPP.   IUGR: has dopplers scheduled with MFM on Wednesday 11/25/22. Keep this appointment. IOL scheduled for Saturday 11/28/22.  D/c home in stable condition. Reviewed discharge instructions, keep upcoming appointments, continue with regular fkc, return for any concerns.    [name], CNM 11/23/2022 2:09 PM

## 2022-11-23 NOTE — Progress Notes (Signed)
Pt given discharge instructions including follow up care and labor precautions. Pt discharged in stable condition. Pt scheduled for IOL this weekend.

## 2022-11-23 NOTE — OB Triage Note (Signed)
32 y.o [id number] presents to L&D triage at [redacted]w[redacted]d for a non-stress test. Pt denies ctx's, lof, vaginal bleeding and states she's ready for her IOL on Saturday. Pt reports good fetal movement and vitals are wnl. Andrey Campanile, CNM aware of pt arrival. Monitors applied and assessing.

## 2022-11-23 NOTE — Discharge Summary (Signed)
Mary Wall is a 32 y.o. female. She is at [redacted]w[redacted]d gestation. No LMP recorded. Patient is pregnant. Estimated Date of Delivery: 12/18/22  Prenatal care site: [facility]    Current pregnancy complicated by:  Fetal growth restriction Marginal cord insertion History of vacuum and forceps deliveries Migraines Abnormal pap smear  Chief complaint: NST for IUGR She has IUGR and has had several non-reactive NSTs in the office requiring prolonged fetal monitoring in L&D. She requested to just do her NSTs in L&D for the duration of the pregnancy.  S: Resting comfortably. no CTX, no VB.no LOF,  Active fetal movement.  Denies: HA, visual changes, SOB, or RUQ/epigastric pain  Maternal Medical History:   Past Medical History:  Diagnosis Date   Anemia    Anxiety 04/30/2020   Blood transfusion without reported diagnosis    Breast disorder    right breast lump   Depression    Gastroesophageal reflux disease without esophagitis 06/23/2019   Migraines    Ovarian cyst     Past Surgical History:  Procedure Laterality Date   BREAST BIOPSY Right 2019   PASH    No Known Allergies  Prior to Admission medications   Medication Sig Start Date End Date Taking? Authorizing Provider  dicyclomine (BENTYL) 20 MG tablet Take 1 tablet (20 mg total) by mouth every 6 (six) hours. 04/08/21  Yes Irean Hong, MD  Multiple Vitamins-Minerals (MULTIVITAMIN) tablet Take 1 tablet by mouth daily. 08/25/19  Yes Matt Holmes, PA  metoCLOPramide (REGLAN) 5 MG tablet Take 1 tablet (5 mg total) by mouth every 6 (six) hours as needed for nausea or vomiting. 10/29/22   Gustavo Lah, CNM  ondansetron (ZOFRAN-ODT) 4 MG disintegrating tablet Take 1 tablet (4 mg total) by mouth every 8 (eight) hours as needed for nausea or vomiting. 10/29/22   Gustavo Lah, CNM  pantoprazole (PROTONIX) 40 MG tablet Take 1 tablet (40 mg total) by mouth daily. 04/08/21   Irean Hong, MD  pyridOXINE (VITAMIN B6) 25 MG tablet  Take 1 tablet (25 mg total) by mouth daily. 04/10/22   Poggi, Herb Grays, PA-C  Vitamin D, Ergocalciferol, (DRISDOL) 1.25 MG (50000 UNIT) CAPS capsule Take 1 capsule (50,000 Units total) by mouth every 7 (seven) days. 06/30/19   Flinchum, Eula Fried, FNP    Social History: She  reports that she has quit smoking. Her smoking use included e-cigarettes. She has never used smokeless tobacco. She reports current alcohol use. She reports current drug use. Drug: Marijuana.  Family History: family history includes Bone cancer in her maternal grandfather; Cancer in her maternal grandfather; Diabetes in her maternal grandmother, mother, and sister; Heart attack in her maternal grandmother; Hypertension in her maternal grandfather, maternal grandmother, mother, and sister; Migraines in her sister; Seizures in her maternal grandmother.  no history of gyn cancers  Review of Systems: A full review of systems was performed and negative except as noted in the HPI.    O:  BP 119/72 (BP Location: Right Arm)   Pulse 88   Temp 98.3 F (36.8 C) (Oral)   Resp 16  No results found for this or any previous visit (from the past 48 hour(s)).   Constitutional: NAD, AAOx3  HE/ENT: extraocular movements grossly intact, moist mucous membranes CV: RRR PULM: nl respiratory effort, CTABL     Abd: gravid, non-tender, non-distended, soft      Ext: Non-tender, Nonedematous   Psych: mood appropriate, speech normal  Fetal  monitoring:  Cat 1 Appropriate for GA Baseline: 140bpm Variability: moderate Accelerations: 2 15x15 accelerations after 2.5hrs of monitoring Decelerations absent Time 2.5hrs  A/P: 32 y.o. [redacted]w[redacted]d here for antenatal surveillance for NST for IUGR  Principle Diagnosis:  Non-reactive NST, IUGR  Dr. Jean Rosenthal updated on assessment and plan of care.  NST: reactive, BPP 10/10. Reassuring fetal status. Prolonged fetal monitoring for 2.5hrs overall reassuring, had two 15x15 accelerations prior to going for her BPP.   IUGR: has dopplers scheduled with MFM on Wednesday 11/25/22. Keep this appointment. IOL scheduled for Saturday 11/28/22.  D/c home in stable condition. Reviewed discharge instructions, keep upcoming appointments, continue with regular fkc, return for any concerns.    [name], CNM 11/23/2022 2:13 PM

## 2022-11-23 NOTE — H&P (Signed)
Mary Wall is a 32 y.o. female. She is at [redacted]w[redacted]d gestation. No LMP recorded. Patient is pregnant. Estimated Date of Delivery: 12/18/22  Prenatal care site: [facility]    Current pregnancy complicated by:  Fetal growth restriction Marginal cord insertion History of vacuum and forceps deliveries Migraines Abnormal pap smear  Chief complaint: sent over from the office for nonreactive NST. She was monitored for an NST for 1 hour in the office and only had 10x10 accelerations, no 15x15 accelerations.   She reports good fetal movement. Denies contractions.   S: Resting comfortably. no CTX, no VB.no LOF,  Active fetal movement.  Denies: HA, visual changes, SOB, or RUQ/epigastric pain  Maternal Medical History:   Past Medical History:  Diagnosis Date   Anemia    Anxiety 04/30/2020   Blood transfusion without reported diagnosis    Breast disorder    right breast lump   Depression    Gastroesophageal reflux disease without esophagitis 06/23/2019   Migraines    Ovarian cyst     Past Surgical History:  Procedure Laterality Date   BREAST BIOPSY Right 2019   PASH    No Known Allergies  Prior to Admission medications   Medication Sig Start Date End Date Taking? Authorizing Provider  dicyclomine (BENTYL) 20 MG tablet Take 1 tablet (20 mg total) by mouth every 6 (six) hours. 04/08/21  Yes Irean Hong, MD  Multiple Vitamins-Minerals (MULTIVITAMIN) tablet Take 1 tablet by mouth daily. 08/25/19  Yes Matt Holmes, PA  metoCLOPramide (REGLAN) 5 MG tablet Take 1 tablet (5 mg total) by mouth every 6 (six) hours as needed for nausea or vomiting. 10/29/22   Gustavo Lah, CNM  ondansetron (ZOFRAN-ODT) 4 MG disintegrating tablet Take 1 tablet (4 mg total) by mouth every 8 (eight) hours as needed for nausea or vomiting. 10/29/22   Gustavo Lah, CNM  pantoprazole (PROTONIX) 40 MG tablet Take 1 tablet (40 mg total) by mouth daily. 04/08/21   Irean Hong, MD  pyridOXINE (VITAMIN  B6) 25 MG tablet Take 1 tablet (25 mg total) by mouth daily. 04/10/22   Poggi, Herb Grays, PA-C  Vitamin D, Ergocalciferol, (DRISDOL) 1.25 MG (50000 UNIT) CAPS capsule Take 1 capsule (50,000 Units total) by mouth every 7 (seven) days. 06/30/19   Flinchum, Eula Fried, FNP    Social History: She  reports that she has quit smoking. Her smoking use included e-cigarettes. She has never used smokeless tobacco. She reports current alcohol use. She reports current drug use. Drug: Marijuana.  Family History: family history includes Bone cancer in her maternal grandfather; Cancer in her maternal grandfather; Diabetes in her maternal grandmother, mother, and sister; Heart attack in her maternal grandmother; Hypertension in her maternal grandfather, maternal grandmother, mother, and sister; Migraines in her sister; Seizures in her maternal grandmother.   no history of gyn cancers  Review of Systems: A full review of systems was performed and negative except as noted in the HPI.     O:  There were no vitals taken for this visit. No results found for this or any previous visit (from the past 48 hour(s)).   Constitutional: NAD, AAOx3  HE/ENT: extraocular movements grossly intact, moist mucous membranes CV: RRR PULM: nl respiratory effort, CTABL     Abd: gravid, non-tender, non-distended, soft      Ext: Non-tender, Nonedematous   Psych: mood appropriate, speech normal  Fetal  monitoring: Cat I Baseline: 135bpm Variability: moderate Accelerations: 10x10 accelerations Decelerations absent  Uterine contractions: none  A/P: 32 y.o. [redacted]w[redacted]d here for antenatal surveillance for nonreactive NST in the office with IUGR  Principle Diagnosis:  non-reactive NST, IUGR  Dr. Jean Rosenthal notified of observation status Prolonged fetal monitoring until reactive, at least 4 hours BPP ordered IUGR - scheduled for UA dopplers in the [facility] office   Danashia Landers Luvenia Heller, CNM 11/23/2022 8:47 AM

## 2022-11-23 NOTE — H&P (Signed)
Mary Wall is a 32 y.o. female. She is at [redacted]w[redacted]d gestation. No LMP recorded. Patient is pregnant. Estimated Date of Delivery: 12/18/22  Prenatal care site: [facility]    Current pregnancy complicated by:  Fetal growth restriction Marginal cord insertion History of vacuum and forceps deliveries Migraines Abnormal pap smear  Chief complaint: NST for IUGR She has IUGR and has had several non-reactive NSTs in the office requiring prolonged fetal monitoring in L&D. She requested to just do her NSTs in L&D for the duration of the pregnancy.  S: Resting comfortably. no CTX, no VB.no LOF,  Active fetal movement.  Denies: HA, visual changes, SOB, or RUQ/epigastric pain  Maternal Medical History:   Past Medical History:  Diagnosis Date   Anemia    Anxiety 04/30/2020   Blood transfusion without reported diagnosis    Breast disorder    right breast lump   Depression    Gastroesophageal reflux disease without esophagitis 06/23/2019   Migraines    Ovarian cyst     Past Surgical History:  Procedure Laterality Date   BREAST BIOPSY Right 2019   PASH    No Known Allergies  Prior to Admission medications   Medication Sig Start Date End Date Taking? Authorizing Provider  dicyclomine (BENTYL) 20 MG tablet Take 1 tablet (20 mg total) by mouth every 6 (six) hours. 04/08/21  Yes Irean Hong, MD  Multiple Vitamins-Minerals (MULTIVITAMIN) tablet Take 1 tablet by mouth daily. 08/25/19  Yes Matt Holmes, PA  metoCLOPramide (REGLAN) 5 MG tablet Take 1 tablet (5 mg total) by mouth every 6 (six) hours as needed for nausea or vomiting. 10/29/22   Gustavo Lah, CNM  ondansetron (ZOFRAN-ODT) 4 MG disintegrating tablet Take 1 tablet (4 mg total) by mouth every 8 (eight) hours as needed for nausea or vomiting. 10/29/22   Gustavo Lah, CNM  pantoprazole (PROTONIX) 40 MG tablet Take 1 tablet (40 mg total) by mouth daily. 04/08/21   Irean Hong, MD  pyridOXINE (VITAMIN B6) 25 MG tablet  Take 1 tablet (25 mg total) by mouth daily. 04/10/22   Poggi, Herb Grays, PA-C  Vitamin D, Ergocalciferol, (DRISDOL) 1.25 MG (50000 UNIT) CAPS capsule Take 1 capsule (50,000 Units total) by mouth every 7 (seven) days. 06/30/19   Flinchum, Eula Fried, FNP      Social History: She  reports that she has quit smoking. Her smoking use included e-cigarettes. She has never used smokeless tobacco. She reports current alcohol use. She reports current drug use. Drug: Marijuana.  Family History: family history includes Bone cancer in her maternal grandfather; Cancer in her maternal grandfather; Diabetes in her maternal grandmother, mother, and sister; Heart attack in her maternal grandmother; Hypertension in her maternal grandfather, maternal grandmother, mother, and sister; Migraines in her sister; Seizures in her maternal grandmother.  no history of gyn cancers  Review of Systems: A full review of systems was performed and negative except as noted in the HPI.    O:  BP 119/72 (BP Location: Right Arm)   Pulse 88   Temp 98.3 F (36.8 C) (Oral)   Resp 16  No results found for this or any previous visit (from the past 48 hour(s)).   Constitutional: NAD, AAOx3  HE/ENT: extraocular movements grossly intact, moist mucous membranes CV: RRR PULM: nl respiratory effort, CTABL     Abd: gravid, non-tender, non-distended, soft      Ext: Non-tender, Nonedematous   Psych: mood appropriate, speech normal  Fetal  monitoring: Cat 1 Appropriate for GA Baseline: 140bpm Variability: moderate Accelerations: none Decelerations absent Time  A/P: 32 y.o. [redacted]w[redacted]d here for antenatal surveillance for NST for IUGR  Principle Diagnosis:  Non-reactive NST, IUGR  NST: non-reactive, monitored for , no 15x15 accelerations present, BPP ordered. Continue continuous fetal monitoring.    [name], CNM 11/23/2022 1:20 PM

## 2022-11-25 ENCOUNTER — Other Ambulatory Visit: Payer: Self-pay

## 2022-11-25 ENCOUNTER — Other Ambulatory Visit: Payer: Self-pay | Admitting: Certified Nurse Midwife

## 2022-11-25 ENCOUNTER — Ambulatory Visit (HOSPITAL_BASED_OUTPATIENT_CLINIC_OR_DEPARTMENT_OTHER): Payer: 59

## 2022-11-25 ENCOUNTER — Encounter: Payer: Self-pay | Admitting: Obstetrics and Gynecology

## 2022-11-25 ENCOUNTER — Observation Stay
Admission: RE | Admit: 2022-11-25 | Discharge: 2022-11-25 | Disposition: A | Discharge status: Home / Self Care | Payer: 59 | Source: Home / Self Care

## 2022-11-25 DIAGNOSIS — Z3A36 36 weeks gestation of pregnancy: Secondary | ICD-10-CM | POA: Insufficient documentation

## 2022-11-25 DIAGNOSIS — O365990 Maternal care for other known or suspected poor fetal growth, unspecified trimester, not applicable or unspecified: Secondary | ICD-10-CM | POA: Diagnosis present

## 2022-11-25 DIAGNOSIS — O43193 Other malformation of placenta, third trimester: Secondary | ICD-10-CM | POA: Insufficient documentation

## 2022-11-25 DIAGNOSIS — Z87891 Personal history of nicotine dependence: Secondary | ICD-10-CM | POA: Insufficient documentation

## 2022-11-25 DIAGNOSIS — O365930 Maternal care for other known or suspected poor fetal growth, third trimester, not applicable or unspecified: Secondary | ICD-10-CM

## 2022-11-25 DIAGNOSIS — Z79899 Other long term (current) drug therapy: Secondary | ICD-10-CM | POA: Insufficient documentation

## 2022-11-25 MED ORDER — ACETAMINOPHEN 500 MG PO TABS: 1000.0000 mg | ORAL_TABLET | Freq: Four times a day (QID) | ORAL | Status: DC | PRN

## 2022-11-25 MED ORDER — CALCIUM CARBONATE ANTACID 500 MG PO CHEW
2.0000 | CHEWABLE_TABLET | ORAL | Status: DC | PRN
Start: 2022-11-25 — End: 2022-11-25

## 2022-11-25 NOTE — Discharge Instructions (Signed)
Induction of labor for [facility] at [facility]   Induction of labor scheduled for 11/27/2022 at 5:00 am    You have been scheduled for induction of labor on 11/27/2022 at 5:00 am.  Please call Labor and Delivery at [phone] an hour before your induction time to make sure that they have bed availability for you.  Please be patient during this time as we cannot control how many babies want to have a birthday at the same time.

## 2022-11-25 NOTE — OB Triage Note (Signed)
Patient arrived to unit from MFM for an NST. Patient is G4P2 and denies HA, epigastric pain, visual changes, vaginal bleeding, LOF or decreased FM. CNM aware of patient's arrival.

## 2022-11-25 NOTE — Discharge Summary (Signed)
Mary Wall is a 32 y.o. female. She is at [redacted]w[redacted]d gestation. No LMP recorded. Patient is pregnant. 12/18/2022, Date entered prior to episode creation   Prenatal care site: [facility]  Chief complaint: Sent from MFM for NST   Admission Diagnoses:  1) intrauterine pregnancy at [redacted]w[redacted]d  2) Pregnancy affected by fetal growth restriction [O36.5990]  Discharge Diagnoses:  Principal Problem:   Pregnancy affected by fetal growth restriction  HPI: [name] presents to L&D for NST following BPP with MFM today.  Her pregnancy is complicated by IUGR, borderline dopplers, marginal cordinsertion .  She denies Contractions, Loss of fluid, or Vaginal bleeding. Endorses fetal movement as active. She was seen by MFM today. Growth [place] completed. Umbilical cord dopplers were borderline today. No evidence of AEDF or REDF. EFW over <10th %tile. AFI WNL, BPP 8/8.   S: Resting comfortably. no CTX, no VB.no LOF,  Active fetal movement.   Maternal Medical History:  Past Medical Hx:  has a past medical history of Anemia, Anxiety (04/30/2020), Blood transfusion without reported diagnosis, Breast disorder, Depression, Gastroesophageal reflux disease without esophagitis (06/23/2019), Migraines, and Ovarian cyst.    Past Surgical Hx:  has a past surgical history that includes Breast biopsy (Right, 2019).   No Known Allergies   Prior to Admission medications   Medication Sig Start Date End Date Taking? Authorizing Provider  Multiple Vitamins-Minerals (MULTIVITAMIN) tablet Take 1 tablet by mouth daily. 08/25/19  Yes Hampton, Carla J, PA  ondansetron (ZOFRAN-ODT) 4 MG disintegrating tablet Take 1 tablet (4 mg total) by mouth every 8 (eight) hours as needed for nausea or vomiting. 10/29/22  Yes Gustavo Lah, CNM  dicyclomine (BENTYL) 20 MG tablet Take 1 tablet (20 mg total) by mouth every 6 (six) hours. Patient not taking: Reported on 11/25/2022 04/08/21   Irean Hong, MD  metoCLOPramide (REGLAN) 5 MG tablet  Take 1 tablet (5 mg total) by mouth every 6 (six) hours as needed for nausea or vomiting. Patient not taking: Reported on 11/25/2022 10/29/22   Gustavo Lah, CNM  pantoprazole (PROTONIX) 40 MG tablet Take 1 tablet (40 mg total) by mouth daily. Patient not taking: Reported on 11/25/2022 04/08/21   Irean Hong, MD  pyridOXINE (VITAMIN B6) 25 MG tablet Take 1 tablet (25 mg total) by mouth daily. Patient not taking: Reported on 11/25/2022 04/10/22   Poggi, Herb Grays, PA-C  Vitamin D, Ergocalciferol, (DRISDOL) 1.25 MG (50000 UNIT) CAPS capsule Take 1 capsule (50,000 Units total) by mouth every 7 (seven) days. Patient not taking: Reported on 11/25/2022 06/30/19   Flinchum, Eula Fried, FNP    Social History: She  reports that she has quit smoking. Her smoking use included e-cigarettes. She has never used smokeless tobacco. She reports that she does not currently use alcohol. She reports that she does not currently use drugs after having used the following drugs: Marijuana.  Family History: family history includes Bone cancer in her maternal grandfather; Cancer in her maternal grandfather; Diabetes in her maternal grandmother, mother, and sister; Heart attack in her maternal grandmother; Hypertension in her maternal grandfather, maternal grandmother, mother, and sister; Migraines in her sister; Seizures in her maternal grandmother.   Review of Systems: A full review of systems was performed and negative except as noted in the HPI.     Pertinent Results:   O:  BP 135/86 (BP Location: Right Arm)   Pulse 87   Temp 98.2 F (36.8 C) (Oral)   Resp 18  No  results found for this or any previous visit (from the past 48 hour(s)).   Constitutional: NAD, AAOx3  PULM: nl respiratory effort Abd: gravid, non-tender Ext: Non-tender, Nonedmeatous Psych: mood appropriate, speech normal Pelvic : deferred  NST: Baseline FHR: 125 beats/min Variability: moderate Accelerations: present Decelerations:  absent Tocometry: None Time: at least 20 minutes   Interpretation: Category I INDICATIONS: intrauterine growth restriction  RESULTS:  A NST procedure was performed with FHR monitoring and a normal baseline established, appropriate time of 20-40 minutes of evaluation, and accels >2 seen w 15x15 characteristics.  Results show a REACTIVE NST.   Procedures: NST  Hospital Course: The patient was admitted to Labor and Delivery Triage for observation. NST completed and was reactive. Discussed with Dr. Jean Rosenthal and Dr. Grace Bushy. MFM recommends delivery at 37 weeks d/t IUGR and borderline dopplers. IOL scheduled for 11/27/2022 at 0500.  Strict kick count instructions were reviewed and when to return to L&D.   She was deemed stable for discharge and further outpatient management.   Discharge Condition:   Disposition: Discharge disposition: 01-Home or Self Care      Allergies as of 11/25/2022   No Known Allergies      Medication List     STOP taking these medications    dicyclomine 20 MG tablet Commonly known as: BENTYL   metoCLOPramide 5 MG tablet Commonly known as: Reglan   pantoprazole 40 MG tablet Commonly known as: PROTONIX   pyridOXINE 25 MG tablet Commonly known as: VITAMIN B6   Vitamin D (Ergocalciferol) 1.25 MG (50000 UNIT) Caps capsule Commonly known as: DRISDOL       TAKE these medications    multivitamin tablet Take 1 tablet by mouth daily.   ondansetron 4 MG disintegrating tablet Commonly known as: ZOFRAN-ODT Take 1 tablet (4 mg total) by mouth every 8 (eight) hours as needed for nausea or vomiting.        Follow-up Information     [facility]. Go on 11/27/2022.   Specialty: Obstetrics and Gynecology Why: at 5:00 am for induction of labor Contact information: [address] [place] [address] [phone]               ----- Gustavo Lah, CNM  Certified Nurse Midwife [facility]

## 2022-11-27 ENCOUNTER — Other Ambulatory Visit: Payer: Self-pay

## 2022-11-27 ENCOUNTER — Inpatient Hospital Stay: Payer: 59

## 2022-11-27 ENCOUNTER — Inpatient Hospital Stay: Payer: 59 | Admitting: Anesthesiology

## 2022-11-27 ENCOUNTER — Inpatient Hospital Stay
Admission: EM | Admit: 2022-11-27 | Discharge: 2022-11-30 | DRG: 787 | Disposition: A | Discharge status: Home / Self Care | Payer: 59

## 2022-11-27 ENCOUNTER — Encounter
Admission: EM | Disposition: A | Discharge status: Home / Self Care | Payer: Self-pay | Source: Home / Self Care | Attending: Obstetrics

## 2022-11-27 ENCOUNTER — Encounter: Payer: Self-pay | Admitting: Obstetrics and Gynecology

## 2022-11-27 DIAGNOSIS — O43193 Other malformation of placenta, third trimester: Secondary | ICD-10-CM | POA: Diagnosis present

## 2022-11-27 DIAGNOSIS — O365930 Maternal care for other known or suspected poor fetal growth, third trimester, not applicable or unspecified: Principal | ICD-10-CM | POA: Diagnosis present

## 2022-11-27 DIAGNOSIS — Z833 Family history of diabetes mellitus: Secondary | ICD-10-CM | POA: Diagnosis not present

## 2022-11-27 DIAGNOSIS — Z8249 Family history of ischemic heart disease and other diseases of the circulatory system: Secondary | ICD-10-CM

## 2022-11-27 DIAGNOSIS — Z87891 Personal history of nicotine dependence: Secondary | ICD-10-CM | POA: Diagnosis not present

## 2022-11-27 DIAGNOSIS — O34211 Maternal care for low transverse scar from previous cesarean delivery: Secondary | ICD-10-CM | POA: Diagnosis present

## 2022-11-27 DIAGNOSIS — O9081 Anemia of the puerperium: Secondary | ICD-10-CM | POA: Diagnosis not present

## 2022-11-27 DIAGNOSIS — O9962 Diseases of the digestive system complicating childbirth: Secondary | ICD-10-CM | POA: Diagnosis present

## 2022-11-27 DIAGNOSIS — K219 Gastro-esophageal reflux disease without esophagitis: Secondary | ICD-10-CM | POA: Diagnosis present

## 2022-11-27 DIAGNOSIS — O365990 Maternal care for other known or suspected poor fetal growth, unspecified trimester, not applicable or unspecified: Secondary | ICD-10-CM | POA: Diagnosis present

## 2022-11-27 DIAGNOSIS — O09299 Supervision of pregnancy with other poor reproductive or obstetric history, unspecified trimester: Secondary | ICD-10-CM

## 2022-11-27 DIAGNOSIS — D62 Acute posthemorrhagic anemia: Secondary | ICD-10-CM | POA: Diagnosis not present

## 2022-11-27 DIAGNOSIS — Z3A37 37 weeks gestation of pregnancy: Secondary | ICD-10-CM | POA: Diagnosis not present

## 2022-11-27 DIAGNOSIS — O99019 Anemia complicating pregnancy, unspecified trimester: Secondary | ICD-10-CM | POA: Diagnosis present

## 2022-11-27 DIAGNOSIS — Z8759 Personal history of other complications of pregnancy, childbirth and the puerperium: Secondary | ICD-10-CM

## 2022-11-27 DIAGNOSIS — Z3490 Encounter for supervision of normal pregnancy, unspecified, unspecified trimester: Principal | ICD-10-CM | POA: Diagnosis present

## 2022-11-27 LAB — RPR: RPR Ser Ql: NONREACTIVE

## 2022-11-27 LAB — BASIC METABOLIC PANEL WITH GFR
Anion gap: 8 (ref 5–15)
BUN: <5 mg/dL — ABNORMAL LOW (ref 6–20)
CO2: 19 mmol/L — ABNORMAL LOW (ref 22–32)
Calcium: 8.3 mg/dL — ABNORMAL LOW (ref 8.9–10.3)
Chloride: 105 mmol/L (ref 98–111)
Creatinine, Ser: 0.54 mg/dL (ref 0.44–1.00)
GFR, Estimated: >60 mL/min
Glucose, Bld: 111 mg/dL — ABNORMAL HIGH (ref 70–99)
Potassium: 3.5 mmol/L (ref 3.5–5.1)
Sodium: 132 mmol/L — ABNORMAL LOW (ref 135–145)

## 2022-11-27 LAB — CBC
HCT: 29 % — ABNORMAL LOW (ref 36.0–46.0)
Hemoglobin: 10 g/dL — ABNORMAL LOW (ref 12.0–15.0)
MCH: 28.6 pg (ref 26.0–34.0)
MCHC: 34.5 g/dL (ref 30.0–36.0)
MCV: 82.9 fL (ref 80.0–100.0)
Platelets: 240 10*3/uL (ref 150–400)
RBC: 3.5 MIL/uL — ABNORMAL LOW (ref 3.87–5.11)
RDW: 13.5 % (ref 11.5–15.5)
WBC: 7.6 10*3/uL (ref 4.0–10.5)
nRBC: 0 % (ref 0.0–0.2)

## 2022-11-27 LAB — TYPE AND SCREEN
ABO/RH(D): O POS
Antibody Screen: NEGATIVE

## 2022-11-27 LAB — URINE DRUG SCREEN, QUALITATIVE (ARMC ONLY)
Amphetamines, Ur Screen: NOT DETECTED
Barbiturates, Ur Screen: NOT DETECTED
Benzodiazepine, Ur Scrn: NOT DETECTED
Cannabinoid 50 Ng, Ur ~~LOC~~: POSITIVE — AB
Cocaine Metabolite,Ur ~~LOC~~: NOT DETECTED
MDMA (Ecstasy)Ur Screen: NOT DETECTED
Methadone Scn, Ur: NOT DETECTED
Opiate, Ur Screen: NOT DETECTED
Phencyclidine (PCP) Ur S: NOT DETECTED
Tricyclic, Ur Screen: NOT DETECTED

## 2022-11-27 SURGERY — Surgical Case
Anesthesia: Epidural

## 2022-11-27 MED ORDER — OXYTOCIN-SODIUM CHLORIDE 30-0.9 UT/500ML-% IV SOLN
1.0000 m[IU]/min | INTRAVENOUS | Status: DC
  Administered 2022-11-27: 2 m[IU]/min via INTRAVENOUS
  Filled 2022-11-27: qty 500

## 2022-11-27 MED ORDER — DIPHENHYDRAMINE HCL 25 MG PO CAPS: 25.0000 mg | ORAL_CAPSULE | ORAL | Status: DC | PRN

## 2022-11-27 MED ORDER — OXYCODONE HCL 5 MG PO TABS
5.0000 mg | ORAL_TABLET | ORAL | Status: DC | PRN
  Administered 2022-11-29: 5 mg via ORAL
  Filled 2022-11-27 (×2): qty 1

## 2022-11-27 MED ORDER — KETOROLAC TROMETHAMINE 30 MG/ML IJ SOLN: 30.0000 mg | Freq: Four times a day (QID) | INTRAMUSCULAR | Status: AC | PRN

## 2022-11-27 MED ORDER — LIDOCAINE HCL (PF) 1 % IJ SOLN
INTRAMUSCULAR | Status: DC | PRN
  Administered 2022-11-27: 3 mL

## 2022-11-27 MED ORDER — COCONUT OIL OIL
1.0000 | TOPICAL_OIL | Status: DC | PRN
  Administered 2022-11-29: 1 via TOPICAL
  Filled 2022-11-27: qty 15

## 2022-11-27 MED ORDER — AMMONIA AROMATIC IN INHA
RESPIRATORY_TRACT | Status: AC
  Filled 2022-11-27: qty 10

## 2022-11-27 MED ORDER — MEPERIDINE HCL 25 MG/ML IJ SOLN
6.2500 mg | INTRAMUSCULAR | Status: DC | PRN
Start: 2022-11-27 — End: 2022-11-27

## 2022-11-27 MED ORDER — ACETAMINOPHEN 325 MG PO TABS: 650.0000 mg | ORAL_TABLET | ORAL | Status: DC | PRN

## 2022-11-27 MED ORDER — LACTATED RINGERS IV SOLN
500.0000 mL | INTRAVENOUS | Status: DC | PRN
  Administered 2022-11-27: 500 mL via INTRAVENOUS

## 2022-11-27 MED ORDER — SOD CITRATE-CITRIC ACID 500-334 MG/5ML PO SOLN: 30.0000 mL | ORAL | Status: DC | PRN

## 2022-11-27 MED ORDER — CARBOPROST TROMETHAMINE 250 MCG/ML IM SOLN
INTRAMUSCULAR | Status: AC
  Filled 2022-11-27: qty 1

## 2022-11-27 MED ORDER — LIDOCAINE HCL (PF) 1 % IJ SOLN: 30.0000 mL | INTRAMUSCULAR | Status: DC | PRN

## 2022-11-27 MED ORDER — PHENYLEPHRINE 80 MCG/ML (10ML) SYRINGE FOR IV PUSH (FOR BLOOD PRESSURE SUPPORT): 80.0000 ug | PREFILLED_SYRINGE | INTRAVENOUS | Status: DC | PRN

## 2022-11-27 MED ORDER — TRANEXAMIC ACID-NACL 1000-0.7 MG/100ML-% IV SOLN
INTRAVENOUS | Status: AC
  Filled 2022-11-27: qty 100

## 2022-11-27 MED ORDER — MEASLES, MUMPS & RUBELLA VAC IJ SOLR
0.5000 mL | Freq: Once | INTRAMUSCULAR | Status: DC
  Filled 2022-11-27: qty 0.5

## 2022-11-27 MED ORDER — PRENATAL MULTIVITAMIN CH
1.0000 | ORAL_TABLET | Freq: Every day | ORAL | Status: DC
  Administered 2022-11-28 – 2022-11-29 (×2): 1 via ORAL
  Filled 2022-11-27 (×3): qty 1

## 2022-11-27 MED ORDER — MENTHOL 3 MG MT LOZG: 1.0000 | LOZENGE | OROMUCOSAL | Status: DC | PRN

## 2022-11-27 MED ORDER — FENTANYL CITRATE (PF) 100 MCG/2ML IJ SOLN
INTRAMUSCULAR | Status: AC
  Filled 2022-11-27: qty 2

## 2022-11-27 MED ORDER — FENTANYL CITRATE (PF) 100 MCG/2ML IJ SOLN
INTRAMUSCULAR | Status: DC | PRN
  Administered 2022-11-27: 100 ug via EPIDURAL

## 2022-11-27 MED ORDER — SODIUM CHLORIDE 0.9 % IV SOLN: 5.0000 10*6.[IU] | Freq: Once | INTRAVENOUS | Status: DC

## 2022-11-27 MED ORDER — SOD CITRATE-CITRIC ACID 500-334 MG/5ML PO SOLN
30.0000 mL | ORAL | Status: AC
  Administered 2022-11-27: 30 mL via ORAL

## 2022-11-27 MED ORDER — SODIUM CHLORIDE 0.9 % IV SOLN
500.0000 mg | INTRAVENOUS | Status: AC
  Administered 2022-11-27: 500 mg via INTRAVENOUS

## 2022-11-27 MED ORDER — NALOXONE HCL 0.4 MG/ML IJ SOLN: 0.4000 mg | INTRAMUSCULAR | Status: DC | PRN

## 2022-11-27 MED ORDER — NALOXONE HCL 4 MG/10ML IJ SOLN: 1.0000 ug/kg/h | INTRAVENOUS | Status: DC | PRN

## 2022-11-27 MED ORDER — SIMETHICONE 80 MG PO CHEW
80.0000 mg | CHEWABLE_TABLET | Freq: Three times a day (TID) | ORAL | Status: DC
  Administered 2022-11-28 – 2022-11-30 (×6): 80 mg via ORAL
  Filled 2022-11-27 (×7): qty 1

## 2022-11-27 MED ORDER — PHENYLEPHRINE 80 MCG/ML (10ML) SYRINGE FOR IV PUSH (FOR BLOOD PRESSURE SUPPORT)
PREFILLED_SYRINGE | INTRAVENOUS | Status: DC | PRN
  Administered 2022-11-27 (×2): 80 ug via INTRAVENOUS

## 2022-11-27 MED ORDER — SODIUM CHLORIDE 0.9% FLUSH: 3.0000 mL | INTRAVENOUS | Status: DC | PRN

## 2022-11-27 MED ORDER — METHYLERGONOVINE MALEATE 0.2 MG/ML IJ SOLN
INTRAMUSCULAR | Status: AC
  Filled 2022-11-27: qty 1

## 2022-11-27 MED ORDER — DIBUCAINE (PERIANAL) 1 % EX OINT: 1.0000 | TOPICAL_OINTMENT | CUTANEOUS | Status: DC | PRN

## 2022-11-27 MED ORDER — POLYETHYLENE GLYCOL 3350 17 G PO PACK
17.0000 g | PACK | Freq: Every day | ORAL | Status: DC
  Administered 2022-11-29 – 2022-11-30 (×2): 17 g via ORAL
  Filled 2022-11-27 (×2): qty 1

## 2022-11-27 MED ORDER — OXYTOCIN 10 UNIT/ML IJ SOLN
INTRAMUSCULAR | Status: AC
  Filled 2022-11-27: qty 2

## 2022-11-27 MED ORDER — ACETAMINOPHEN 500 MG PO TABS
1000.0000 mg | ORAL_TABLET | Freq: Four times a day (QID) | ORAL | Status: AC
Start: 2022-11-27 — End: 2022-11-28
  Administered 2022-11-27 – 2022-11-28 (×4): 1000 mg via ORAL
  Filled 2022-11-27 (×4): qty 2

## 2022-11-27 MED ORDER — DIPHENHYDRAMINE HCL 25 MG PO CAPS: 25.0000 mg | ORAL_CAPSULE | Freq: Four times a day (QID) | ORAL | Status: DC | PRN

## 2022-11-27 MED ORDER — OXYTOCIN BOLUS FROM INFUSION: 333.0000 mL | Freq: Once | INTRAVENOUS | Status: DC

## 2022-11-27 MED ORDER — FENTANYL-BUPIVACAINE-NACL 0.5-0.125-0.9 MG/250ML-% EP SOLN
EPIDURAL | Status: DC | PRN
  Administered 2022-11-27: 10 mL/h via EPIDURAL

## 2022-11-27 MED ORDER — KETOROLAC TROMETHAMINE 30 MG/ML IJ SOLN
INTRAMUSCULAR | Status: DC | PRN
Start: 2022-11-27 — End: 2022-11-27
  Administered 2022-11-27: 30 mg via INTRAVENOUS

## 2022-11-27 MED ORDER — LIDOCAINE-EPINEPHRINE (PF) 2 %-1:200000 IJ SOLN
INTRAMUSCULAR | Status: DC | PRN
  Administered 2022-11-27: 5 mL via EPIDURAL
  Administered 2022-11-27: 3 mL via EPIDURAL
  Administered 2022-11-27: 5 mL via EPIDURAL

## 2022-11-27 MED ORDER — MISOPROSTOL 25 MCG QUARTER TABLET
25.0000 ug | ORAL_TABLET | ORAL | Status: DC
  Administered 2022-11-27: 25 ug via VAGINAL
  Filled 2022-11-27: qty 1

## 2022-11-27 MED ORDER — KETOROLAC TROMETHAMINE 30 MG/ML IJ SOLN
INTRAMUSCULAR | Status: AC
  Filled 2022-11-27: qty 1

## 2022-11-27 MED ORDER — CEFAZOLIN SODIUM-DEXTROSE 2-4 GM/100ML-% IV SOLN
2.0000 g | INTRAVENOUS | Status: AC
  Administered 2022-11-27: 2 g via INTRAVENOUS

## 2022-11-27 MED ORDER — DIPHENHYDRAMINE HCL 50 MG/ML IJ SOLN: 12.5000 mg | INTRAMUSCULAR | Status: DC | PRN

## 2022-11-27 MED ORDER — MORPHINE SULFATE (PF) 0.5 MG/ML IJ SOLN
INTRAMUSCULAR | Status: DC | PRN
  Administered 2022-11-27: 3 mg via EPIDURAL

## 2022-11-27 MED ORDER — TERBUTALINE SULFATE 1 MG/ML IJ SOLN: 0.2500 mg | Freq: Once | INTRAMUSCULAR | Status: DC | PRN

## 2022-11-27 MED ORDER — CEFAZOLIN SODIUM-DEXTROSE 2-4 GM/100ML-% IV SOLN
INTRAVENOUS | Status: AC
  Filled 2022-11-27: qty 100

## 2022-11-27 MED ORDER — FENTANYL-BUPIVACAINE-NACL 0.5-0.125-0.9 MG/250ML-% EP SOLN: 12.0000 mL/h | EPIDURAL | Status: DC | PRN

## 2022-11-27 MED ORDER — CARBOPROST TROMETHAMINE 250 MCG/ML IM SOLN
INTRAMUSCULAR | Status: DC | PRN
  Administered 2022-11-27: 250 ug via INTRAMUSCULAR

## 2022-11-27 MED ORDER — LACTATED RINGERS IV SOLN
INTRAVENOUS | Status: DC
  Administered 2022-11-27: 1000 mL via INTRAVENOUS

## 2022-11-27 MED ORDER — LIDOCAINE-EPINEPHRINE (PF) 1.5 %-1:200000 IJ SOLN
INTRAMUSCULAR | Status: DC | PRN
  Administered 2022-11-27: 3 mL via PERINEURAL

## 2022-11-27 MED ORDER — SOD CITRATE-CITRIC ACID 500-334 MG/5ML PO SOLN
ORAL | Status: AC
  Filled 2022-11-27: qty 15

## 2022-11-27 MED ORDER — FENTANYL-BUPIVACAINE-NACL 0.5-0.125-0.9 MG/250ML-% EP SOLN
EPIDURAL | Status: AC
  Filled 2022-11-27: qty 250

## 2022-11-27 MED ORDER — MORPHINE SULFATE (PF) 0.5 MG/ML IJ SOLN
INTRAMUSCULAR | Status: AC
  Filled 2022-11-27: qty 10

## 2022-11-27 MED ORDER — PENICILLIN G POT IN DEXTROSE 60000 UNIT/ML IV SOLN
3.0000 10*6.[IU] | INTRAVENOUS | Status: DC
Start: 2022-11-27 — End: 2022-11-27

## 2022-11-27 MED ORDER — EPHEDRINE 5 MG/ML INJ: 10.0000 mg | INTRAVENOUS | Status: DC | PRN

## 2022-11-27 MED ORDER — ACETAMINOPHEN 325 MG PO TABS: 650.0000 mg | ORAL_TABLET | Freq: Four times a day (QID) | ORAL | Status: DC

## 2022-11-27 MED ORDER — LIDOCAINE HCL (PF) 1 % IJ SOLN
INTRAMUSCULAR | Status: AC
  Filled 2022-11-27: qty 30

## 2022-11-27 MED ORDER — MISOPROSTOL 200 MCG PO TABS
ORAL_TABLET | ORAL | Status: AC
  Filled 2022-11-27: qty 4

## 2022-11-27 MED ORDER — KETOROLAC TROMETHAMINE 30 MG/ML IJ SOLN
30.0000 mg | Freq: Four times a day (QID) | INTRAMUSCULAR | Status: AC | PRN
  Administered 2022-11-28 (×2): 30 mg via INTRAVENOUS
  Filled 2022-11-27 (×2): qty 1

## 2022-11-27 MED ORDER — ONDANSETRON HCL 4 MG/2ML IJ SOLN
INTRAMUSCULAR | Status: AC
Start: 2022-11-27 — End: ?
  Filled 2022-11-27: qty 2

## 2022-11-27 MED ORDER — WITCH HAZEL-GLYCERIN EX PADS: 1.0000 | MEDICATED_PAD | CUTANEOUS | Status: DC | PRN

## 2022-11-27 MED ORDER — OXYCODONE HCL 5 MG PO TABS
5.0000 mg | ORAL_TABLET | Freq: Four times a day (QID) | ORAL | Status: DC | PRN
Start: 2022-11-27 — End: 2022-11-30

## 2022-11-27 MED ORDER — SCOPOLAMINE 1 MG/3DAYS TD PT72
1.0000 | MEDICATED_PATCH | Freq: Once | TRANSDERMAL | Status: DC
  Administered 2022-11-27: 1.5 mg via TRANSDERMAL
  Filled 2022-11-27: qty 1

## 2022-11-27 MED ORDER — SENNA 8.6 MG PO TABS
1.0000 | ORAL_TABLET | Freq: Every day | ORAL | Status: DC
  Administered 2022-11-30: 8.6 mg via ORAL
  Filled 2022-11-27 (×3): qty 1

## 2022-11-27 MED ORDER — ONDANSETRON HCL 4 MG/2ML IJ SOLN: 4.0000 mg | Freq: Four times a day (QID) | INTRAMUSCULAR | Status: DC | PRN

## 2022-11-27 MED ORDER — FENTANYL CITRATE (PF) 100 MCG/2ML IJ SOLN
50.0000 ug | INTRAMUSCULAR | Status: DC | PRN
  Administered 2022-11-27: 50 ug via INTRAVENOUS
  Filled 2022-11-27: qty 2

## 2022-11-27 MED ORDER — LACTATED RINGERS IV SOLN: 500.0000 mL | Freq: Once | INTRAVENOUS | Status: DC

## 2022-11-27 MED ORDER — SODIUM CHLORIDE 0.9 % IV SOLN
INTRAVENOUS | Status: AC
  Filled 2022-11-27: qty 5

## 2022-11-27 MED ORDER — IBUPROFEN 600 MG PO TABS
600.0000 mg | ORAL_TABLET | Freq: Four times a day (QID) | ORAL | Status: DC
Start: 2022-11-27 — End: 2022-11-30
  Administered 2022-11-28 – 2022-11-30 (×8): 600 mg via ORAL
  Filled 2022-11-27 (×8): qty 1

## 2022-11-27 MED ORDER — SIMETHICONE 80 MG PO CHEW
80.0000 mg | CHEWABLE_TABLET | ORAL | Status: DC | PRN
  Administered 2022-11-28: 80 mg via ORAL

## 2022-11-27 MED ORDER — METHYLERGONOVINE MALEATE 0.2 MG/ML IJ SOLN
INTRAMUSCULAR | Status: DC | PRN
  Administered 2022-11-27: .2 mg via INTRAMUSCULAR

## 2022-11-27 MED ORDER — TETANUS-DIPHTH-ACELL PERTUSSIS 5-2.5-18.5 LF-MCG/0.5 IM SUSY
0.5000 mL | PREFILLED_SYRINGE | Freq: Once | INTRAMUSCULAR | Status: DC
Start: 2022-11-28 — End: 2022-11-30

## 2022-11-27 MED ORDER — ONDANSETRON HCL 4 MG/2ML IJ SOLN: 4.0000 mg | Freq: Three times a day (TID) | INTRAMUSCULAR | Status: DC | PRN

## 2022-11-27 MED ORDER — PHENYLEPHRINE 80 MCG/ML (10ML) SYRINGE FOR IV PUSH (FOR BLOOD PRESSURE SUPPORT)
PREFILLED_SYRINGE | INTRAVENOUS | Status: AC
Start: 2022-11-27 — End: ?
  Filled 2022-11-27: qty 10

## 2022-11-27 MED ORDER — ONDANSETRON HCL 4 MG/2ML IJ SOLN
INTRAMUSCULAR | Status: DC | PRN
  Administered 2022-11-27: 4 mg via INTRAVENOUS

## 2022-11-27 MED ORDER — MISOPROSTOL 25 MCG QUARTER TABLET
25.0000 ug | ORAL_TABLET | ORAL | Status: DC
  Administered 2022-11-27: 25 ug via ORAL
  Filled 2022-11-27: qty 1

## 2022-11-27 MED ORDER — OXYTOCIN-SODIUM CHLORIDE 30-0.9 UT/500ML-% IV SOLN
2.5000 [IU]/h | INTRAVENOUS | Status: AC
  Administered 2022-11-28: 2.5 [IU]/h via INTRAVENOUS
  Filled 2022-11-27: qty 500

## 2022-11-27 MED ORDER — BUPIVACAINE HCL (PF) 0.25 % IJ SOLN
INTRAMUSCULAR | Status: AC
  Filled 2022-11-27: qty 60

## 2022-11-27 MED ORDER — OXYTOCIN-SODIUM CHLORIDE 30-0.9 UT/500ML-% IV SOLN
2.5000 [IU]/h | INTRAVENOUS | Status: DC
  Administered 2022-11-27: 300 m[IU]/h via INTRAVENOUS
  Administered 2022-11-27: 600 m[IU]/h via INTRAVENOUS
  Filled 2022-11-27 (×2): qty 500

## 2022-11-27 MED ORDER — BUPIVACAINE HCL (PF) 0.25 % IJ SOLN
INTRAMUSCULAR | Status: DC | PRN
  Administered 2022-11-27 (×2): 3 mL via EPIDURAL

## 2022-11-27 MED ORDER — LOPERAMIDE HCL 2 MG PO CAPS
2.0000 mg | ORAL_CAPSULE | ORAL | Status: DC | PRN
  Administered 2022-11-27 (×2): 2 mg via ORAL
  Filled 2022-11-27 (×3): qty 1

## 2022-11-27 SURGICAL SUPPLY — 4 items
DRSG TELFA 3X8 NADH STRL (GAUZE/BANDAGES/DRESSINGS) IMPLANT
GAUZE SPONGE 4X4 12PLY STRL LF (GAUZE/BANDAGES/DRESSINGS) IMPLANT
SUT VIC AB 0 CTX36XBRD ANBCTRL (SUTURE) IMPLANT
TAPE MEDIFIX FOAM 3 (GAUZE/BANDAGES/DRESSINGS) IMPLANT

## 2022-11-27 NOTE — Progress Notes (Signed)
L&D Note    Subjective:  has no unusual complaints  Objective:   Vitals:   11/27/22 0934 11/27/22 1110 11/27/22 1425 11/27/22 1428  BP:   120/69 126/68  Pulse:   74 73  Resp: 20 16    Temp:  98.1 F (36.7 C)    TempSrc:  Oral    SpO2:   99%   Weight:      Height:        Current Vital Signs 24h Vital Sign Ranges  T 98.1 F (36.7 C) Temp  Avg: 98.6 F (37 C)  Min: 98.1 F (36.7 C)  Max: 99.1 F (37.3 C)  BP 126/68 BP  Min: 120/69  Max: 126/68  HR 73 Pulse  Avg: 81.7  Min: 73  Max: 98  RR 16 Resp  Avg: 17.7  Min: 16  Max: 20  SaO2 99 % Room Air SpO2  Avg: 99 %  Min: 99 %  Max: 99 %      Gen: alert, cooperative, no distress FHR: Baseline: 130 bpm, Variability: moderate, Accels: Abscent, Decels: variable repetitive Toco: regular, every 2-4 minutes SVE: Dilation: 6 Effacement (%): 70 Cervical Position: Posterior Station: -2 Exam by:: D Schermerhorn  Medications SCHEDULED MEDICATIONS   ammonia       misoprostol       misoprostol  25 mcg Oral Q4H   And   misoprostol  25 mcg Vaginal Q4H   oxytocin       oxytocin 40 units in LR 1000 mL  333 mL Intravenous Once    MEDICATION INFUSIONS   lactated ringers 500 mL (11/27/22 1403)   lactated ringers 1,000 mL (11/27/22 1330)   oxytocin     oxytocin 8 milli-units/min (11/27/22 1130)   pencillin G potassium IV     Followed by   pencillin G potassium IV      PRN MEDICATIONS  acetaminophen, ammonia, fentaNYL (SUBLIMAZE) injection, lactated ringers, lidocaine (PF), misoprostol, ondansetron, oxytocin, sodium citrate-citric acid, terbutaline   Assessment & Plan:  32 y.o. [id number] at 103w0d admitted for IUGR with normal to elevated UAD -Labor: Active phase labor. and Significant vaginal bleeding bloody show and bloody fluid. -Fetal Well-being: Category II -GBS: negative -Membranes ruptured, bloody x2 hours 45 mins -Anticipate vaginal delivery. and Intervention: IV Pitocin augmentation, change maternal position, and placed  IUPC -Analgesia: regional anesthesia   Chari Manning, CNM  11/27/2022 3:41 PM  [facility]

## 2022-11-27 NOTE — Progress Notes (Signed)
OB Labor Note   S: Patient worried about baby. Wants to proceed with c/s.      O: BP 126/68   Pulse 73   Temp 98.7 F (37.1 C) (Oral)   Resp 16   Ht 5\' 1"  (1.549 m)   Wt 71.7 kg   SpO2 99%   BMI 29.85 kg/m  Temp (24hrs), Avg:98.6 F (37 C), Min:98.1 F (36.7 C), Max:99.1 F (37.3 C)    FHT: Baseline 130, mod variability, + accels, + decels. Cat 2 TOCO: q2-3 min   Cervical Exam: 6/70/-2 Membranes: AROM, blood tinged, 1305   A/P: Mary Wall is a 32 y.o. [id number] [redacted]w[redacted]d admitted for IOL for FGR with normal to elevated UAD.   #IOL FGR w/ normal to elevated UAD - Cervical dilation: closed on admission at 0600. Cook catheter placed at 0900 at 1 cm and pitocin started. Cook catheter removed at 1305 and 5 cm; AROM at that time. Changed to 6 cm at 1440. 3 min decel at 1555 and cervix remained unchanged, IUPC and FSE placed. Pitocin turned off and patient in hands and knees with improvement in FHT. Another 3 min decel at 1648 which improved with hands and knees. Pitocin has remained off. Given NRFHT remote from delivery and inability to safely induce in the setting of FGR with elevated UAD, discussed recommendation for cesarean section and patient in agreement. Will proceed with urgent c/s. Charge and anesthesia notified.  - Discussed and consented the patient for a cesarean section. Risks such as infection, bleeding, organ damage, anesthesia complications, and need for possible blood products were discussed. Patient will accept products in case of an emergency. We discussed the risks of a blood transfusion, such as a 1 in 1.2-1.4 million chance of contracting HIV, Hep C.  - Offered BTL and patient not interested - Pre-op orders placed - CEFM: Cat 2 due to decels. Improved (cat 1) now in hands and knees.  - Vitals: afebrile, HR normal, BP normal - Rh + - GBS neg  - Pain tolerable s/p epidural  #Hx VAVD and FAVD: Both for NRFHT    Electronically signed by:  Romana Juniper, MD, 11/27/2022, 5:18 PM

## 2022-11-27 NOTE — Progress Notes (Signed)
OB Labor Note   S: Doing well. Feels better with epidural.      O: BP 126/68   Pulse 73   Temp 98.1 F (36.7 C) (Oral)   Resp 16   Ht 5\' 1"  (1.549 m)   Wt 71.7 kg   SpO2 99%   BMI 29.85 kg/m  Temp (24hrs), Avg:98.6 F (37 C), Min:98.1 F (36.7 C), Max:99.1 F (37.3 C)    FHT: Baseline 130, mod variability, + accels, no decels. Cat 1  TOCO: q1-2 min   Cervical Exam: 6/70/-2 Membranes: AROM, blood tinged, 1305   A/P: Mary Wall is a 32 y.o. [id number] [redacted]w[redacted]d admitted for IOL for FGR with normal to elevated UAD.   #IOL FGR w/ normal to elevated UAD - Cervical dilation: closed on admission at 0600. Cook catheter placed at 0900 at 1 cm and pitocin started. Cook catheter removed at 1305 and 5 cm; AROM at that time. Now 6 cm (1440). Pit @ 8, continue to titrate per protocol.  - CEFM: Cat 1  - Vitals: afebrile, HR normal, BP normal - Rh + - GBS neg  - Pain tolerable s/p epidural  #Hx VAVD and FAVD: Both for NRFHT  #MCI: Careful traction with delivery of placenta   Electronically signed by:  Romana Juniper, MD, 11/27/2022, 3:22 PM

## 2022-11-27 NOTE — H&P (Addendum)
OB History & Physical   History of Present Illness:  Chief Complaint:   HPI:  Mary Wall is a 32 y.o. [id number] female at [redacted]w[redacted]d dated by u/s.  She presents to L&D for IOL for FGR with  The mean umbilical artery Doppler S/D ratio measures 3.09. The 95th percentile for 35 weeks is 3.59 on her last u/s.  She reports:  -active fetal movement -no leakage of fluid -no vaginal bleeding -no contractions  Pregnancy Issues: Fetal growth restriction H/o Forceps and vacuum deliveries Migraines Abnormal Pap Marginal Cord Insertion   11/19/22: The mean umbilical artery Doppler S/D ratio measures 3.09. The 95th percentile for 35 weeks is 3.59.  11/11/22:  EFW     2,195 g       16% [place]    4 lb 13 oz     Maternal Medical History:   Past Medical History:  Diagnosis Date   Anemia    Anxiety 04/30/2020   Blood transfusion without reported diagnosis    Breast disorder    right breast lump   Depression    Gastroesophageal reflux disease without esophagitis 06/23/2019   Migraines    Ovarian cyst     Past Surgical History:  Procedure Laterality Date   BREAST BIOPSY Right 2019   PASH    No Known Allergies  Prior to Admission medications   Medication Sig Start Date End Date Taking? Authorizing Provider  Multiple Vitamins-Minerals (MULTIVITAMIN) tablet Take 1 tablet by mouth daily. 08/25/19  Yes Hampton, Carla J, PA  ondansetron (ZOFRAN-ODT) 4 MG disintegrating tablet Take 1 tablet (4 mg total) by mouth every 8 (eight) hours as needed for nausea or vomiting. 10/29/22   Gustavo Lah, CNM     Prenatal care site: [facility]  Social History: She  reports that she has quit smoking. Her smoking use included e-cigarettes. She has never used smokeless tobacco. She reports that she does not currently use alcohol. She reports that she does not currently use drugs after having used the following drugs: Marijuana.  Family History: family history includes Bone cancer in her  maternal grandfather; Cancer in her maternal grandfather; Diabetes in her maternal grandmother, mother, and sister; Heart attack in her maternal grandmother; Hypertension in her maternal grandfather, maternal grandmother, mother, and sister; Migraines in her sister; Seizures in her maternal grandmother.   Review of Systems: A full review of systems was performed and negative except as noted in the HPI.    Physical Exam:  Vital Signs: BP 121/84 (BP Location: Left Arm)   Pulse 98   Temp 99.1 F (37.3 C) (Oral)   Resp 17   Ht 5\' 1"  (1.549 m)   Wt 71.7 kg   BMI 29.85 kg/m   General:   alert and cooperative  Skin:  normal  Neurologic:    Alert & oriented x 3  Lungs:    Nl effort  Heart:   regular rate and rhythm  Abdomen:  soft, non-tender; bowel sounds normal; no masses,  no organomegaly  Extremities: : non-tender, symmetric, no edema bilaterally.        Results for orders placed or performed during the hospital encounter of 11/27/22 (from the past 24 hour(s))  CBC     Status: Abnormal   Collection Time: 11/27/22  5:34 AM  Result Value Ref Range   WBC 7.6 4.0 - 10.5 K/uL   RBC 3.50 (L) 3.87 - 5.11 MIL/uL   Hemoglobin 10.0 (L) 12.0 - 15.0 g/dL  HCT 29.0 (L) 36.0 - 46.0 %   MCV 82.9 80.0 - 100.0 fL   MCH 28.6 26.0 - 34.0 pg   MCHC 34.5 30.0 - 36.0 g/dL   RDW 14.7 82.9 - 56.2 %   Platelets 240 150 - 400 K/uL   nRBC 0.0 0.0 - 0.2 %  Type and screen     Status: None   Collection Time: 11/27/22  5:34 AM  Result Value Ref Range   ABO/RH(D) O POS    Antibody Screen NEG    Sample Expiration      11/30/2022,2359 Performed at [facility], [address] [place], [place] [address]     Pertinent Results:  Prenatal Labs: Blood type/Rh O pos  Antibody screen neg  Rubella Immune  Varicella Immune  RPR NR  HBsAg Neg  HIV NR  GC neg  Chlamydia neg  Genetic screening negative  1 hour GTT 138  3 hour GTT F73, 115, 104, 108   GBS Neg   FHT: FHR: 130 bpm,  variability: moderate,  accelerations:  Present,  decelerations:  Absent Category/reactivity:  Category I TOCO: none SVE: Dilation: Closed / Effacement (%): Thick / Station: Agricultural consultant by [employer]  [place] MFM OB DETAIL +14 WK  Result Date: 11/25/2022 ----------------------------------------------------------------------  OBSTETRICS REPORT                        (Signed Final 11/25/2022 11:30 am) ---------------------------------------------------------------------- Patient Info  ID #:       [id number]                          D.O.B.:  12-30-1990 (32 yrs)(F)  Name:       Mary Wall                Visit Date: 11/25/2022 10:07 am ---------------------------------------------------------------------- Performed By  Attending:        Lin Landsman      Ref. Address:      Almon Register                    MD  Performed By:     Reinaldo Raddle            [facility]                    RD[facility]                                      [facility]etal [facility] at                                                              [facility]  Referred By:      Gustavo Lah                    CNM ---------------------------------------------------------------------- Orders  #  Description                           Code  Ordered By  1  [place] MFM OB DETAIL +14 WK               [id number]    Donato Schultz  2  [place] MFM FETAL BPP WO NON               76819.01    DANIELLE WILSON     STRESS  3  [place] MFM UA CORD DOPPLER                [phone]    Donato Schultz ----------------------------------------------------------------------  #  Order #                     Accession #                Episode #  1  [id number]                   [id number]                 [id number]  2  [id number]                   [id number]                 [id number]  3  [id number]                   [id number]                 [id number] ---------------------------------------------------------------------- Indications  Maternal care for known  or suspected poor       O36.5930  fetal growth, third trimester, single or  unspecified fetus IUGR  Marginal insertion of umbilical cord affecting  O43.192  management of mother in second trimester  (seen at outside office)  [redacted] weeks gestation of pregnancy                 Z3A.36  MT21 Neg ---------------------------------------------------------------------- Fetal Evaluation  Num Of Fetuses:          1  Fetal Heart Rate(bpm):   145  Cardiac Activity:        Observed  Presentation:            Cephalic  Placenta:                Posterior  P. Cord Insertion:       Not well visualized  Amniotic Fluid  AFI FV:      Within normal limits  AFI Sum(cm)     %Tile       Largest Pocket(cm)  10.99           31          3.65  RUQ(cm)       RLQ(cm)       LUQ(cm)        LLQ(cm)  1.96          3.65          2.05           3.33 ---------------------------------------------------------------------- Biophysical Evaluation  Amniotic F.V:   Within normal limits       F. Tone:         Observed  F. Movement:    Observed                   Score:           8/8  F. Breathing:   Observed ---------------------------------------------------------------------- Biometry  BPD:  86.32  mm     G. Age:  34w 6d         14  %    CI:        78.33   %    70 - 86                                                          FL/HC:       21.1  %    20.8 - 22.6  HC:    308.53   mm     G. Age:  34w 3d        < 1  %    HC/AC:       0.99       0.92 - 1.05  AC:    311.06   mm     G. Age:  35w 0d         18  %    FL/BPD:      75.3  %    71 - 87  FL:      65.03  mm     G. Age:  33w 4d        1.2  %    FL/AC:       20.9  %    20 - 24  HUM:      54.7  mm     G. Age:  31w 6d        < 5  %  CER:      47.9  mm     G. Age:  36w 0d         33  %  LV:        2.7  mm  CM:        7.1  mm  Est. FW:    2457   gm     5 lb 7 oz      9  % ---------------------------------------------------------------------- OB History  Gravidity:    4         Term:   2        Prem:   0         SAB:   1  TOP:          0       Ectopic:  0        Living: 2 ---------------------------------------------------------------------- Gestational Age  Clinical EDD:  36w 5d                                        EDD:   12/18/22  U/S Today:     34w 3d                                        EDD:   01/03/23  Best:          36w 5d     Det. By:  Clinical EDD             EDD:   12/18/22 ---------------------------------------------------------------------- Targeted Anatomy  Central Nervous System  Calvarium/Cranial V.:  Appears normal  Cereb./Vermis:          Appears normal  Cavum:                 Appears normal         Cisterna Magna:         Appears normal  Lateral Ventricles:    Appears normal         Midline Falx:           Appears normal  Choroid Plexus:        Appears normal  Spine  Cervical:              Limited                Sacral:                 Limited  Thoracic:              Limited                Shape/Curvature:        Appears normal  Lumbar:                Limited  Head/Neck  Lips:                  Appears normal         Profile:                Appears normal  Neck:                  Appears normal         Orbits/Eyes:            Appears normal  Nuchal Fold:           Not applicable         Mandible:               Appears normal  Nasal Bone:            Present                Maxilla:                Appears normal  Thorax  4 Chamber View:        Appears normal         Interventr. Septum:     Appears normal  Cardiac Rhythm:        Normal                 Cardiac Axis:           Normal  Cardiac Situs:         Appears normal         Diaphragm:              Appears normal  Rt Outflow Tract:      Appears normal         3 Vessel View:          Not well visualized  Lt Outflow Tract:      Appears normal         3 V Trachea View:       Appears normal  Aortic Arch:           Appears normal         IVC:  Appears normal  Ductal Arch:           Not well visualized    Crossing:               Appears  normal  SVC:                   Appears normal  Abdomen  Ventral Wall:          Appears normal         Lt Kidney:              Appears normal  Cord Insertion:        Appears normal         Rt Kidney:              Appears normal  Situs:                 Appears normal         Bladder:                Appears normal  Stomach:               Appears normal  Extremities  Lt Humerus:            Appears normal         Lt Femur:               Appears normal  Rt Humerus:            Appears normal         Rt Femur:               Appears normal  Lt Forearm:            Visualized             Lt Lower Leg:           Appears normal  Rt Forearm:            Visualized             Rt Lower Leg:           Appears normal  Lt Hand:               Visualized             Lt Foot:                Visualized  Rt Hand:               Visualized             Rt Foot:                Visualized  Other  Umbilical Cord:        Normal 3-vessel        Genitalia:              Female-nml  Comment:     Technically difficult due to gestational age. ---------------------------------------------------------------------- Doppler - Fetal Vessels  Umbilical Artery    S/D    %tile      RI    %tile      PI    %tile     PSV    ADFV    RDFV                                                     (  cm/s)   3.15       89    0.71       96    1.12       94    58.62      No      No ---------------------------------------------------------------------- Cervix Uterus Adnexa  Cervix  Not visualized (advanced GA >24wks)  Uterus  No abnormality visualized.  Right Ovary  Not visualized.  Left Ovary  Not visualized.  Cul De Sac  No free fluid seen.  Adnexa  No adnexal mass visualized ---------------------------------------------------------------------- Impression  Follow up growth due to prior IUGR with abnormal testing.  Normal interval growth with measurements consistent with  EFW 8%.  Good fetal movement and amniotic fluid volume  BPP 8/8  The UAD were overall normal however  there were some  Dopplers that were elevated. There is no evidence of AEDF  or REDF.  Suboptimal views of the fetal anatomy were obtained  secondary to fetal position.  I spoke with Margaretmary Eddy, CNM and MS. Hamade will go to  L&D for and NST if non reactive recommend delivery.  She is scheduled for delivery on Saturday. ---------------------------------------------------------------------- Recommendations  To L&D for NST. ----------------------------------------------------------------------                  Lin Landsman, MD Electronically Signed Final Report   11/25/2022 11:30 am ----------------------------------------------------------------------   [place] MFM FETAL BPP WO NON STRESS  Result Date: 11/25/2022 ----------------------------------------------------------------------  OBSTETRICS REPORT                        (Signed Final 11/25/2022 11:30 am) ---------------------------------------------------------------------- Patient Info  ID #:       [id number]                          D.O.B.:  09-18-1990 (32 yrs)(F)  Name:       Mary Wall                Visit Date: 11/25/2022 10:07 am ---------------------------------------------------------------------- Performed By  Attending:        Lin Landsman      Ref. Address:      Almon Register                    MD  Performed By:     Reinaldo Raddle            [facility]                    RD[facility]                                      Fetal Care at                                                              [facility]  Referred By:      Gustavo Lah                    CNM ---------------------------------------------------------------------- Orders  #  Description  Code        Ordered By  1  [place] MFM OB DETAIL +14 WK               [id number]    Donato Schultz  2  [place] MFM FETAL BPP WO NON               76819.01    DANIELLE WILSON     STRESS  3  [place] MFM UA CORD DOPPLER                [phone]    Donato Schultz ----------------------------------------------------------------------  #  Order #                     Accession #                Episode #  1  [id number]                   [id number]                 [id number]  2  [id number]                   [id number]                 [id number]  3  [id number]                   [id number]                 [id number] ---------------------------------------------------------------------- Indications  Maternal care for known or suspected poor       O36.5930  fetal growth, third trimester, single or  unspecified fetus IUGR  Marginal insertion of umbilical cord affecting  O43.192  management of mother in second trimester  (seen at outside office)  [redacted] weeks gestation of pregnancy                 Z3A.36  MT21 Neg ---------------------------------------------------------------------- Fetal Evaluation  Num Of Fetuses:          1  Fetal Heart Rate(bpm):   145  Cardiac Activity:        Observed  Presentation:            Cephalic  Placenta:                Posterior  P. Cord Insertion:       Not well visualized  Amniotic Fluid  AFI FV:      Within normal limits  AFI Sum(cm)     %Tile       Largest Pocket(cm)  10.99           31          3.65  RUQ(cm)       RLQ(cm)       LUQ(cm)        LLQ(cm)  1.96          3.65          2.05           3.33 ---------------------------------------------------------------------- Biophysical Evaluation  Amniotic F.V:   Within normal limits       F. Tone:         Observed  F. Movement:    Observed                   Score:           8/8  F.  Breathing:   Observed ---------------------------------------------------------------------- Biometry  BPD:     86.32  mm     G. Age:  34w 6d         14  %    CI:        78.33   %    70 - 86                                                          FL/HC:       21.1  %    20.8 - 22.6  HC:    308.53   mm     G. Age:  34w 3d        < 1  %    HC/AC:       0.99       0.92 - 1.05  AC:    311.06   mm     G. Age:  35w 0d         18  %     FL/BPD:      75.3  %    71 - 87  FL:      65.03  mm     G. Age:  33w 4d        1.2  %    FL/AC:       20.9  %    20 - 24  HUM:      54.7  mm     G. Age:  31w 6d        < 5  %  CER:      47.9  mm     G. Age:  36w 0d         33  %  LV:        2.7  mm  CM:        7.1  mm  Est. FW:    2457   gm     5 lb 7 oz      9  % ---------------------------------------------------------------------- OB History  Gravidity:    4         Term:   2        Prem:   0        SAB:   1  TOP:          0       Ectopic:  0        Living: 2 ---------------------------------------------------------------------- Gestational Age  Clinical EDD:  36w 5d                                        EDD:   12/18/22  U/S Today:     34w 3d                                        EDD:   01/03/23  Best:          36w 5d     Det. By:  Clinical EDD             EDD:   12/18/22 ----------------------------------------------------------------------  Targeted Anatomy  Central Nervous System  Calvarium/Cranial V.:  Appears normal         Cereb./Vermis:          Appears normal  Cavum:                 Appears normal         Cisterna Magna:         Appears normal  Lateral Ventricles:    Appears normal         Midline Falx:           Appears normal  Choroid Plexus:        Appears normal  Spine  Cervical:              Limited                Sacral:                 Limited  Thoracic:              Limited                Shape/Curvature:        Appears normal  Lumbar:                Limited  Head/Neck  Lips:                  Appears normal         Profile:                Appears normal  Neck:                  Appears normal         Orbits/Eyes:            Appears normal  Nuchal Fold:           Not applicable         Mandible:               Appears normal  Nasal Bone:            Present                Maxilla:                Appears normal  Thorax  4 Chamber View:        Appears normal         Interventr. Septum:     Appears normal  Cardiac Rhythm:        Normal                  Cardiac Axis:           Normal  Cardiac Situs:         Appears normal         Diaphragm:              Appears normal  Rt Outflow Tract:      Appears normal         3 Vessel View:          Not well visualized  Lt Outflow Tract:      Appears normal         3 V Trachea View:       Appears normal  Aortic Arch:           Appears normal  IVC:                    Appears normal  Ductal Arch:           Not well visualized    Crossing:               Appears normal  SVC:                   Appears normal  Abdomen  Ventral Wall:          Appears normal         Lt Kidney:              Appears normal  Cord Insertion:        Appears normal         Rt Kidney:              Appears normal  Situs:                 Appears normal         Bladder:                Appears normal  Stomach:               Appears normal  Extremities  Lt Humerus:            Appears normal         Lt Femur:               Appears normal  Rt Humerus:            Appears normal         Rt Femur:               Appears normal  Lt Forearm:            Visualized             Lt Lower Leg:           Appears normal  Rt Forearm:            Visualized             Rt Lower Leg:           Appears normal  Lt Hand:               Visualized             Lt Foot:                Visualized  Rt Hand:               Visualized             Rt Foot:                Visualized  Other  Umbilical Cord:        Normal 3-vessel        Genitalia:              Female-nml  Comment:     Technically difficult due to gestational age. ---------------------------------------------------------------------- Doppler - Fetal Vessels  Umbilical Artery    S/D    %tile      RI    %tile      PI    %tile     PSV    ADFV    RDFV                                                     (  cm/s)   3.15       89    0.71       96    1.12       94    58.62      No      No ---------------------------------------------------------------------- Cervix Uterus Adnexa  Cervix  Not visualized (advanced GA >24wks)  Uterus   No abnormality visualized.  Right Ovary  Not visualized.  Left Ovary  Not visualized.  Cul De Sac  No free fluid seen.  Adnexa  No adnexal mass visualized ---------------------------------------------------------------------- Impression  Follow up growth due to prior IUGR with abnormal testing.  Normal interval growth with measurements consistent with  EFW 8%.  Good fetal movement and amniotic fluid volume  BPP 8/8  The UAD were overall normal however there were some  Dopplers that were elevated. There is no evidence of AEDF  or REDF.  Suboptimal views of the fetal anatomy were obtained  secondary to fetal position.  I spoke with Margaretmary Eddy, CNM and MS. Rebollar will go to  L&D for and NST if non reactive recommend delivery.  She is scheduled for delivery on Saturday. ---------------------------------------------------------------------- Recommendations  To L&D for NST. ----------------------------------------------------------------------                  Lin Landsman, MD Electronically Signed Final Report   11/25/2022 11:30 am ----------------------------------------------------------------------   [place] MFM UA CORD DOPPLER  Result Date: 11/25/2022 ----------------------------------------------------------------------  OBSTETRICS REPORT                        (Signed Final 11/25/2022 11:30 am) ---------------------------------------------------------------------- Patient Info  ID #:       [id number]                          D.O.B.:  Oct 31, 1990 (32 yrs)(F)  Name:       Mary Wall                Visit Date: 11/25/2022 10:07 am ---------------------------------------------------------------------- Performed By  Attending:        Lin Landsman      Ref. Address:      Almon Register                    MD  Performed By:     Reinaldo Raddle            [facility]                    RD[facility]                                      [facility] at                                                               [facility]  Referred By:      Gustavo Lah                    CNM ---------------------------------------------------------------------- Orders  #  Description  Code        Ordered By  1  [place] MFM OB DETAIL +14 WK               [id number]    Donato Schultz  2  [place] MFM FETAL BPP WO NON               76819.01    DANIELLE WILSON     STRESS  3  [place] MFM UA CORD DOPPLER                [phone]    Donato Schultz ----------------------------------------------------------------------  #  Order #                     Accession #                Episode #  1  [id number]                   [id number]                 [id number]  2  [id number]                   [id number]                 [id number]  3  [id number]                   [id number]                 [id number] ---------------------------------------------------------------------- Indications  Maternal care for known or suspected poor       O36.5930  fetal growth, third trimester, single or  unspecified fetus IUGR  Marginal insertion of umbilical cord affecting  O43.192  management of mother in second trimester  (seen at outside office)  [redacted] weeks gestation of pregnancy                 Z3A.36  MT21 Neg ---------------------------------------------------------------------- Fetal Evaluation  Num Of Fetuses:          1  Fetal Heart Rate(bpm):   145  Cardiac Activity:        Observed  Presentation:            Cephalic  Placenta:                Posterior  P. Cord Insertion:       Not well visualized  Amniotic Fluid  AFI FV:      Within normal limits  AFI Sum(cm)     %Tile       Largest Pocket(cm)  10.99           31          3.65  RUQ(cm)       RLQ(cm)       LUQ(cm)        LLQ(cm)  1.96          3.65          2.05           3.33 ---------------------------------------------------------------------- Biophysical Evaluation  Amniotic F.V:   Within normal limits       F. Tone:         Observed  F. Movement:    Observed                   Score:            8/8  F.  Breathing:   Observed ---------------------------------------------------------------------- Biometry  BPD:     86.32  mm     G. Age:  34w 6d         14  %    CI:        78.33   %    70 - 86                                                          FL/HC:       21.1  %    20.8 - 22.6  HC:    308.53   mm     G. Age:  34w 3d        < 1  %    HC/AC:       0.99       0.92 - 1.05  AC:    311.06   mm     G. Age:  35w 0d         18  %    FL/BPD:      75.3  %    71 - 87  FL:      65.03  mm     G. Age:  33w 4d        1.2  %    FL/AC:       20.9  %    20 - 24  HUM:      54.7  mm     G. Age:  31w 6d        < 5  %  CER:      47.9  mm     G. Age:  36w 0d         33  %  LV:        2.7  mm  CM:        7.1  mm  Est. FW:    2457   gm     5 lb 7 oz      9  % ---------------------------------------------------------------------- OB History  Gravidity:    4         Term:   2        Prem:   0        SAB:   1  TOP:          0       Ectopic:  0        Living: 2 ---------------------------------------------------------------------- Gestational Age  Clinical EDD:  36w 5d                                        EDD:   12/18/22  U/S Today:     34w 3d                                        EDD:   01/03/23  Best:          36w 5d     Det. By:  Clinical EDD             EDD:   12/18/22 ---------------------------------------------------------------------- Targeted  Anatomy  Central Nervous System  Calvarium/Cranial V.:  Appears normal         Cereb./Vermis:          Appears normal  Cavum:                 Appears normal         Cisterna Magna:         Appears normal  Lateral Ventricles:    Appears normal         Midline Falx:           Appears normal  Choroid Plexus:        Appears normal  Spine  Cervical:              Limited                Sacral:                 Limited  Thoracic:              Limited                Shape/Curvature:        Appears normal  Lumbar:                Limited  Head/Neck  Lips:                  Appears normal          Profile:                Appears normal  Neck:                  Appears normal         Orbits/Eyes:            Appears normal  Nuchal Fold:           Not applicable         Mandible:               Appears normal  Nasal Bone:            Present                Maxilla:                Appears normal  Thorax  4 Chamber View:        Appears normal         Interventr. Septum:     Appears normal  Cardiac Rhythm:        Normal                 Cardiac Axis:           Normal  Cardiac Situs:         Appears normal         Diaphragm:              Appears normal  Rt Outflow Tract:      Appears normal         3 Vessel View:          Not well visualized  Lt Outflow Tract:      Appears normal         3 V Trachea View:       Appears normal  Aortic Arch:           Appears normal  IVC:                    Appears normal  Ductal Arch:           Not well visualized    Crossing:               Appears normal  SVC:                   Appears normal  Abdomen  Ventral Wall:          Appears normal         Lt Kidney:              Appears normal  Cord Insertion:        Appears normal         Rt Kidney:              Appears normal  Situs:                 Appears normal         Bladder:                Appears normal  Stomach:               Appears normal  Extremities  Lt Humerus:            Appears normal         Lt Femur:               Appears normal  Rt Humerus:            Appears normal         Rt Femur:               Appears normal  Lt Forearm:            Visualized             Lt Lower Leg:           Appears normal  Rt Forearm:            Visualized             Rt Lower Leg:           Appears normal  Lt Hand:               Visualized             Lt Foot:                Visualized  Rt Hand:               Visualized             Rt Foot:                Visualized  Other  Umbilical Cord:        Normal 3-vessel        Genitalia:              Female-nml  Comment:     Technically difficult due to gestational age.  ---------------------------------------------------------------------- Doppler - Fetal Vessels  Umbilical Artery    S/D    %tile      RI    %tile      PI    %tile     PSV    ADFV    RDFV                                                     (  cm/s)   3.15       89    0.71       96    1.12       94    58.62      No      No ---------------------------------------------------------------------- Cervix Uterus Adnexa  Cervix  Not visualized (advanced GA >24wks)  Uterus  No abnormality visualized.  Right Ovary  Not visualized.  Left Ovary  Not visualized.  Cul De Sac  No free fluid seen.  Adnexa  No adnexal mass visualized ---------------------------------------------------------------------- Impression  Follow up growth due to prior IUGR with abnormal testing.  Normal interval growth with measurements consistent with  EFW 8%.  Good fetal movement and amniotic fluid volume  BPP 8/8  The UAD were overall normal however there were some  Dopplers that were elevated. There is no evidence of AEDF  or REDF.  Suboptimal views of the fetal anatomy were obtained  secondary to fetal position.  I spoke with Margaretmary Eddy, CNM and MS. [place] will go to  L&D for and NST if non reactive recommend delivery.  She is scheduled for delivery on Saturday. ---------------------------------------------------------------------- Recommendations  To L&D for NST. ----------------------------------------------------------------------                  Lin Landsman, MD Electronically Signed Final Report   11/25/2022 11:30 am ----------------------------------------------------------------------   US FETAL BPP WO NON STRESS  Result Date: 11/23/2022 CLINICAL DATA:  Nonreactive NST. Assigned gestational age [redacted] weeks 3 days. EXAM: BIOPHYSICAL PROFILE FINDINGS: Number of Fetuses: 1 Heart Rate: 127 bpm Presentation: Cephalic Movement: 2      Time: 30 minutes Breathing: 2 Tone: 2 Amniotic Fluid: 2 Total Score: 8/8 IMPRESSION: Biophysical profile score  is 8 out of 8. Electronically Signed   By: Agustin Cree M.D.   On: 11/23/2022 13:45   US FETAL BPP WO NON STRESS  Result Date: 11/18/2022 CLINICAL DATA:  Nonreactive NST EXAM: BIOPHYSICAL PROFILE FINDINGS: Number of Fetuses: 1 Heart Rate: 145 bpm Presentation: Cephalic Amniotic fluid vertical pocket: 5.4 cm, 3.1 cm. Movement: 2 time: 18 minutes Breathing: 2 Tone: 2 Amniotic Fluid: 2 Total Score: 8 IMPRESSION: Biophysical profile total score 8/8. Electronically Signed   By: Darliss Cheney M.D.   On: 11/18/2022 16:47   US FETAL BPP WO NON STRESS  Result Date: 11/12/2022 CLINICAL DATA:  Fetal growth restriction EXAM: BIOPHYSICAL PROFILE FINDINGS: Number of Fetuses: 1 Heart Rate: 131 bpm Presentation: Cephalic Movement: 2 time: 20 minutes Breathing: 2 Tone: 2 Amniotic Fluid: 2 Total Score: 8 IMPRESSION: Biophysical profile 8 out of 8 Electronically Signed   By: Elige Ko M.D.   On: 11/12/2022 13:18   US FETAL BPP WO NON STRESS  Result Date: 11/11/2022 CLINICAL DATA:  098119 Non-reactive NST (non-stress test) 147829 EXAM: BIOPHYSICAL PROFILE FINDINGS: Number of Fetuses: 1 Heart Rate: 138 bpm Movement: 2 time: 30 minutes Breathing: 0 Tone: 2 Amniotic Fluid: 2 Total Score: 6 IMPRESSION: 1. Biophysical profile 6/8. Electronically Signed   By: Sharlet Salina M.D.   On: 11/11/2022 22:18   US FETAL BPP WO NON STRESS  Result Date: 11/11/2022 CLINICAL DATA:  Marginal insertion of umbilical cord. Nonreactive NST. EXAM: BIOPHYSICAL PROFILE FINDINGS: Number of Fetuses: 1 Heart Rate: 130 bpm Presentation: Cephalic Movement: 2      Time: 30 minutes Breathing: 0 Tone: 0 Amniotic Fluid: 2 Total Score: 4/8 IMPRESSION: Biophysical profile score is 4 out of 8. Critical Value/emergent results were called by telephone  at the time of interpretation on 11/11/2022 at 5:04 pm to Kae Heller, RN, who verbally acknowledged these results. Electronically Signed   By: Agustin Cree M.D.   On: 11/11/2022 17:06   [place] MFM OB  LIMITED  Result Date: 11/11/2022 ----------------------------------------------------------------------  OBSTETRICS REPORT                       (Signed Final 11/11/2022 04:08 pm) ---------------------------------------------------------------------- Patient Info  ID #:       [id number]                          D.O.B.:  1990/04/14 (32 yrs)  Name:       Mary Wall                Visit Date: 11/11/2022 03:29 pm ---------------------------------------------------------------------- Performed By  Attending:        Noralee Space MD        Ref. Address:     [facility]  Performed By:     Anabel Halon          [facility]                                     [facility] at                                                             [facility]  Referred By:      Gustavo Lah                    CNM ---------------------------------------------------------------------- Orders  #  Description                           Code        Ordered By  1  [place] MFM OB LIMITED                     95188.41    [facility]  2  [place] MFM UA CORD DOPPLER                66063.01    RAVI [facility] ----------------------------------------------------------------------  #  Order #                     Accession #                Episode #  1  [id number]                   [id number]                 [id number]  2  [id number]                   [id number]                 [id number] ---------------------------------------------------------------------- Indications  [redacted] weeks gestation of pregnancy  Z3A.34  Maternal care for known or suspected poor      O36.5930  fetal growth, third trimester, single or  unspecified fetus IUGR ---------------------------------------------------------------------- Fetal Evaluation  Num Of Fetuses:         1  Fetal Heart Rate(bpm):  130  Cardiac Activity:       Observed  Presentation:           Cephalic  Placenta:               Posterior  P. Cord Insertion:       Not well visualized  Amniotic Fluid  AFI FV:      Within normal limits  AFI Sum(cm)     %Tile       Largest Pocket(cm)  10.59           24          4.06  RUQ(cm)       RLQ(cm)       LUQ(cm)        LLQ(cm)  1.37          4.06          2.36           2.8 ---------------------------------------------------------------------- OB History  Gravidity:    4         Term:   2        Prem:   0        SAB:   1  TOP:          0       Ectopic:  0        Living: 2 ---------------------------------------------------------------------- Gestational Age  Clinical EDD:  34w 5d                                        EDD:   12/18/22  Best:          34w 5d     Det. By:  Clinical EDD             EDD:   12/18/22 ---------------------------------------------------------------------- Anatomy  Diaphragm:             Appears normal         Kidneys:                Appear normal  Stomach:               Appears normal, left   Bladder:                Appears normal                         sided ---------------------------------------------------------------------- Doppler - Fetal Vessels  Umbilical Artery   S/D     %tile      RI    %tile      PI    %tile     PSV    ADFV    RDFV                                                     (cm/s)   2.75       66    0.64  74    0.98       76    44.37      No      No ---------------------------------------------------------------------- Impression  G4 P2012@34w  5d gestation.  Patient had biophysical profile  at your office an hour or two ago, and the BPP score was 4/8  (breathing and tone did not meet the criteria).  She had fetal  growth assessment in the estimated fetal weight was at the  16th percentile (4 pounds 13 ounces) and the abdominal  circumference was below the 10th percentile.  Patient is  admitted to labor and delivery for NST monitoring.  Obstetric history is significant for 2 term vaginal deliveries.  A limited ultrasound study was performed.  Amniotic fluid is  normal good fetal  activity seen.  Breech presentation.  Umbilical artery Doppler showed normal forward diastolic  flow.  BPP was not performed.  I reassured the patient of the findings.  I discussed the findings and recommendations with Margaretmary Eddy, CNM. ---------------------------------------------------------------------- Recommendations  -If NST is reactive, patient may be discharged, and a repeat  biophysical profile performed tomorrow morning. ----------------------------------------------------------------------                 Noralee Space, MD Electronically Signed Final Report   11/11/2022 04:08 pm ----------------------------------------------------------------------   [place] MFM UA CORD DOPPLER  Result Date: 11/11/2022 ----------------------------------------------------------------------  OBSTETRICS REPORT                       (Signed Final 11/11/2022 04:08 pm) ---------------------------------------------------------------------- Patient Info  ID #:       [id number]                          D.O.B.:  June 11, 1990 (32 yrs)  Name:       Mary Wall                Visit Date: 11/11/2022 03:29 pm ---------------------------------------------------------------------- Performed By  Attending:        Noralee Space MD        Ref. Address:     [facility]  Performed By:     Anabel Halon          [facility]         [facility]                    RD[facility]                                     Fetal Care at                                                             [facility]  Referred By:      Gustavo Lah                    CNM ---------------------------------------------------------------------- Orders  #  Description                           Code        Ordered By  1  [place] MFM OB  LIMITED                     76815.01    RAVI SHANKAR  2  [place] MFM UA CORD DOPPLER                47829.56    [facility] ----------------------------------------------------------------------  #  Order #                     Accession #                 Episode #  1  [id number]                   [id number]                 [id number]  2  [id number]                   [id number]                 [id number] ---------------------------------------------------------------------- Indications  [redacted] weeks gestation of pregnancy                Z3A.34  Maternal care for known or suspected poor      O36.5930  fetal growth, third trimester, single or  unspecified fetus IUGR ---------------------------------------------------------------------- Fetal Evaluation  Num Of Fetuses:         1  Fetal Heart Rate(bpm):  130  Cardiac Activity:       Observed  Presentation:           Cephalic  Placenta:               Posterior  P. Cord Insertion:      Not well visualized  Amniotic Fluid  AFI FV:      Within normal limits  AFI Sum(cm)     %Tile       Largest Pocket(cm)  10.59           24          4.06  RUQ(cm)       RLQ(cm)       LUQ(cm)        LLQ(cm)  1.37          4.06          2.36           2.8 ---------------------------------------------------------------------- OB History  Gravidity:    4         Term:   2        Prem:   0        SAB:   1  TOP:          0       Ectopic:  0        Living: 2 ---------------------------------------------------------------------- Gestational Age  Clinical EDD:  34w 5d                                        EDD:   12/18/22  Best:          34w 5d     Det. By:  Clinical EDD             EDD:   12/18/22 ---------------------------------------------------------------------- Anatomy  Diaphragm:             Appears normal  Kidneys:                Appear normal  Stomach:               Appears normal, left   Bladder:                Appears normal                         sided ---------------------------------------------------------------------- Doppler - Fetal Vessels  Umbilical Artery   S/D     %tile      RI    %tile      PI    %tile     PSV    ADFV    RDFV                                                     (cm/s)   2.75       66    0.64       74     0.98       76    44.37      No      No ---------------------------------------------------------------------- Impression  G4 P2012@34w  5d gestation.  Patient had biophysical profile  at your office an hour or two ago, and the BPP score was 4/8  (breathing and tone did not meet the criteria).  She had fetal  growth assessment in the estimated fetal weight was at the  16th percentile (4 pounds 13 ounces) and the abdominal  circumference was below the 10th percentile.  Patient is  admitted to labor and delivery for NST monitoring.  Obstetric history is significant for 2 term vaginal deliveries.  A limited ultrasound study was performed.  Amniotic fluid is  normal good fetal activity seen.  Breech presentation.  Umbilical artery Doppler showed normal forward diastolic  flow.  BPP was not performed.  I reassured the patient of the findings.  I discussed the findings and recommendations with Margaretmary Eddy, CNM. ---------------------------------------------------------------------- Recommendations  -If NST is reactive, patient may be discharged, and a repeat  biophysical profile performed tomorrow morning. ----------------------------------------------------------------------                 Noralee Space, MD Electronically Signed Final Report   11/11/2022 04:08 pm ----------------------------------------------------------------------     Assessment:  Mary Wall is a 32 y.o. [phone] female at [redacted]w[redacted]d with FGR.   Plan:  1. Admit to Labor & Delivery; consents reviewed and obtained  2. Fetal Well being  - Fetal Tracing: Cat I - GBS neg - Presentation: vtx confirmed by u/s   3. Routine OB: - Prenatal labs reviewed, as above - Rh pos - CBC & T&S on admit - Clear fluids, IVF  4. Induction of Labor -  Contractions by external toco in place -  Plan for induction with cook catheter -  Plan for continuous fetal monitoring  -  Maternal pain control as desired: IVPM, nitrous, regional anesthesia - Anticipate  vaginal delivery  5. Post Partum Planning: - Infant feeding: Breast and formula - Contraception: Nexplanon - Tdap: Given 09/29/22  - Flu: Given 09/29/22  - RSV: Given 10/28/22   Haroldine Laws, CNM 11/27/2022 7:23 AM

## 2022-11-27 NOTE — Progress Notes (Signed)
L&D Note    Subjective:  has no unusual complaints  Objective:   Vitals:   11/27/22 0548  BP: 121/84  Pulse: 98  Resp: 17  Temp: 99.1 F (37.3 C)  TempSrc: Oral  Weight: 71.7 kg  Height: 5\' 1"  (1.549 m)    Current Vital Signs 24h Vital Sign Ranges  T 99.1 F (37.3 C) Temp  Avg: 99.1 F (37.3 C)  Min: 99.1 F (37.3 C)  Max: 99.1 F (37.3 C)  BP 121/84 BP  Min: 121/84  Max: 121/84  HR 98 Pulse  Avg: 98  Min: 98  Max: 98  RR 17 Resp  Avg: 17  Min: 17  Max: 17  SaO2   Room Air No data recorded      Gen: alert, cooperative, no distress FHR: Baseline: 135 bpm, Variability: moderate, Accels: Present, Decels: none Toco: irregular, every 2-5 minutes SVE: Dilation: 1.5 Effacement (%): 60 Cervical Position: Posterior Station: -2 Exam by:: dickeerson CNM  Medications SCHEDULED MEDICATIONS   ammonia       lidocaine (PF)       misoprostol       misoprostol  25 mcg Oral Q4H   And   misoprostol  25 mcg Vaginal Q4H   oxytocin       oxytocin 40 units in LR 1000 mL  333 mL Intravenous Once    MEDICATION INFUSIONS   lactated ringers     lactated ringers 125 mL/hr at 11/27/22 1610   oxytocin     oxytocin     pencillin G potassium IV     Followed by   pencillin G potassium IV      PRN MEDICATIONS  acetaminophen, ammonia, fentaNYL (SUBLIMAZE) injection, lactated ringers, lidocaine (PF), lidocaine (PF), misoprostol, ondansetron, oxytocin, sodium citrate-citric acid, terbutaline   Assessment & Plan:  32 y.o. [id number] at [redacted]w[redacted]d admitted for IUGR -Labor: Not in labor. -Fetal Well-being: Category I -GBS: negative -Membranes intact -Intervention: IV Pitocin induction and placed cooks cervical catheter after discussion and permission from patient -Analgesia: IVPMPRN    Chari Manning, CNM  11/27/2022 9:42 AM  [facility]

## 2022-11-27 NOTE — Discharge Summary (Signed)
Postpartum Discharge Summary  Patient Name: Mary Wall DOB: 07-May-1990 MRN: [id number]  Date of admission: 11/27/2022 Delivery date:11/27/2022 Delivering provider: Romana Juniper Date of discharge: 11/30/2022  Primary OB: [facility] LMP:No LMP recorded. EDC Estimated Date of Delivery: 12/18/22 Gestational Age at Delivery: [redacted]w[redacted]d   Admitting diagnosis: Encounter for planned induction of labor [Z34.90] Intrauterine pregnancy: [redacted]w[redacted]d     Secondary diagnosis:   Principal Problem:   Encounter for planned induction of labor Active Problems:   Anemia affecting pregnancy   Marginal insertion of umbilical cord affecting management of mother in third trimester   IUGR (intrauterine growth restriction) affecting care of mother   History of forceps delivery in prior pregnancy, currently pregnant   History of vacuum extraction assisted delivery   Discharge Diagnosis: Term Pregnancy Delivered and Anemia                                                Post partum procedures: Venofer iron infusion Induction:: AROM, Pitocin, and IP Foley Complications: None Delivery Type: primary cesarean section, low vertical incision Anesthesia: epidural anesthesia Placenta: spontaneous To Pathology: No   Prenatal Labs:  Blood type/Rh O pos  Antibody screen neg  Rubella Immune  Varicella Immune  RPR NR  HBsAg Neg  HIV NR  GC neg  Chlamydia neg  Genetic screening negative  1 hour GTT 138  3 hour GTT F73, 115, 104, 108   GBS Neg     Hospital course: Patient was admitted on 11/27/22 at [redacted]w[redacted]d for IOL for FGR with normal to elevated umbilical artery dopplers. Cervix closed on admission 11/22 at 0600. Cook catheter placed at 0900 at 1 cm and pitocin started. Cook catheter removed at 1305 and 5 cm; AROM at that time. Changed to 6 cm at 1440. 3 min decel at 1555 and cervix remained unchanged, IUPC and FSE placed. Pitocin turned off and patient in hands and knees with improvement in  FHT. Another 3 min decel at 1648 which improved with hands and knees. Pitocin has remained off. Given NRFHT remote from delivery and inability to safely induce in the setting of FGR with elevated UAD, discussed recommendation for cesarean section and patient in agreement. PLTCS for non-reassuring fetal status remote from delivery on 11/27/22 at 1608. Induction of Labor With Cesarean Section   Membrane Rupture Time/Date: 1:05 PM,11/27/2022  Delivery Method:C-Section, Low Transverse Operative Delivery:N/A Details of operation can be found in separate operative Note.  Patient had a postpartum course complicated by postpartum anemia. She received IV iron transfusion x 1. She is ambulating, tolerating a regular diet, passing flatus, and urinating well.  Patient is discharged home in stable condition on 11/30/22.      Newborn Data: Birth date:11/27/2022 Birth time:6:08 PM Gender:Female Living status:Living Apgars:8 ,9  Weight:2126 g                               She is discharged home on Miralax daily x30d and senna 2 tabs daily x 7 days.   MMR:N/A Varivax vaccine given: was not indicated - Tdap: Given 09/29/22  - Flu: Given 09/29/22  - RSV: Given 10/28/22   Transfusion:No  Physical exam  Vitals:   11/29/22 0750 11/29/22 1724 11/30/22 0129 11/30/22 0759  BP: 111/80 136/89 112/66 125/81  Pulse: 77 73  87 79  Resp: 19 20 20 20   Temp: 98.3 F (36.8 C) 98.6 F (37 C) 98.9 F (37.2 C) 98.2 F (36.8 C)  TempSrc: Oral Oral Oral Oral  SpO2: 100%  99% 100%  Weight:      Height:       General: alert, cooperative, and no distress Lochia: appropriate Uterine Fundus: firm Perineum: minimal edema/intact Incision: Healing well with no significant drainage, Dressing is clean, dry, and intact, covered with occlusive OP site dressing  DVT Evaluation: No evidence of DVT seen on physical exam.  Labs: Lab Results  Component Value Date   WBC 10.3 11/29/2022   HGB 8.5 (L) 11/29/2022   HCT 24.8  (L) 11/29/2022   MCV 82.9 11/29/2022   PLT 218 11/29/2022      Latest Ref Rng & Units 11/28/2022    5:11 AM  CMP  Glucose 70 - 99 mg/dL 536   BUN 6 - 20 mg/dL <5   Creatinine 6.44 - 1.00 mg/dL 0.34   Sodium 742 - 595 mmol/L 132   Potassium 3.5 - 5.1 mmol/L 3.6   Chloride 98 - 111 mmol/L 103   CO2 22 - 32 mmol/L 21   Calcium 8.9 - 10.3 mg/dL 8.6    Edinburgh Score:    11/27/2022   11:00 PM  Edinburgh Postnatal Depression Scale Screening Tool  I have been able to laugh and see the funny side of things. 0  I have looked forward with enjoyment to things. 0  I have blamed myself unnecessarily when things went wrong. 2  I have been anxious or worried for no good reason. 0  I have felt scared or panicky for no good reason. 0  Things have been getting on top of me. 2  I have been so unhappy that I have had difficulty sleeping. 0  I have felt sad or miserable. 0  I have been so unhappy that I have been crying. 0  The thought of harming myself has occurred to me. 0  Edinburgh Postnatal Depression Scale Total 4    Risk assessment for postpartum VTE and prophylactic treatment: Very high risk factors: None High risk factors: If 1 risk factor, mechanical prophylaxis and early ambulation  and Unscheduled cesarean after labor  Moderate risk factors: None  Postpartum VTE prophylaxis with LMWH not indicated  After visit meds:  Allergies as of 11/30/2022   No Known Allergies      Medication List     STOP taking these medications    ondansetron 4 MG disintegrating tablet Commonly known as: ZOFRAN-ODT       TAKE these medications    acetaminophen 500 MG tablet Commonly known as: TYLENOL Take 2 tablets (1,000 mg total) by mouth every 6 (six) hours as needed for mild pain (pain score 1-3).   ferrous sulfate 325 (65 FE) MG tablet Take 1 tablet (325 mg total) by mouth daily with breakfast.   ibuprofen 600 MG tablet Commonly known as: ADVIL Take 1 tablet (600 mg total) by  mouth every 6 (six) hours as needed for mild pain (pain score 1-3), moderate pain (pain score 4-6) or cramping.   multivitamin tablet Take 1 tablet by mouth daily.   oxyCODONE 5 MG immediate release tablet Commonly known as: Oxy IR/ROXICODONE Take 1 tablet (5 mg total) by mouth every 4 (four) hours as needed for up to 5 days for moderate pain (pain score 4-6), severe pain (pain score 7-10) or breakthrough pain.   polyethylene glycol  17 g packet Commonly known as: MIRALAX / GLYCOLAX Take 17 g by mouth daily. Start taking on: December 01, 2022   senna 8.6 MG Tabs tablet Commonly known as: SENOKOT Take 1 tablet (8.6 mg total) by mouth daily for 7 days. Start taking on: December 01, 2022       Discharge home in stable condition Infant Feeding:  Expressed breast milk  Infant Disposition:NICU Discharge instruction: per After Visit Summary and Postpartum booklet. Activity: Advance as tolerated. Pelvic rest for 6 weeks.  Diet: routine diet Anticipated Birth Control: Nexplanon Postpartum Appointment:2 weeks Additional Postpartum F/U: Incision check 2 weeks  Future Appointments:No future appointments. Follow up Visit:  Follow-up Information     Derald Lorge, Festus Holts, MD. Schedule an appointment as soon as possible for a visit in 2 week(s).   Specialty: Obstetrics and Gynecology Why: post-op incision check Contact information: [address] [place] [address] [phone]         Deadrian Toya, Festus Holts, MD. Schedule an appointment as soon as possible for a visit in 6 week(s).   Specialty: Obstetrics and Gynecology Why: postpartum visit Contact information: [address] [place] [address] [place] [address] [phone]                 Plan:  Mary Wall was discharged to home in good condition. Follow-up appointment as directed.    Signed:  Margaretmary Eddy, CNM Certified Nurse Midwife [facility]

## 2022-11-27 NOTE — Anesthesia Procedure Notes (Signed)
Epidural Patient location during procedure: OB Start time: 11/27/2022 2:05 PM End time: 11/27/2022 2:20 PM  Staffing Anesthesiologist: Louie Boston, MD Resident/CRNA: Hezzie Bump, CRNA Performed: resident/CRNA   Preanesthetic Checklist Completed: patient identified, IV checked, site marked, risks and benefits discussed, surgical consent, monitors and equipment checked, pre-op evaluation and timeout performed  Epidural Patient position: sitting Prep: ChloraPrep Patient monitoring: heart rate, continuous pulse ox and blood pressure Approach: midline Location: L3-L4 Injection technique: LOR saline  Needle:  Needle type: Tuohy  Needle gauge: 17 G Needle length: 9 cm and 9 Needle insertion depth: 8 cm Catheter type: closed end flexible Catheter size: 19 Gauge Catheter at skin depth: 12 cm Test dose: negative and 1.5% lidocaine with Epi 1:200 K  Assessment Sensory level: T10 Events: blood not aspirated, no cerebrospinal fluid, injection not painful, no injection resistance, no paresthesia and negative IV test  Additional Notes 2 attempt Pt. Evaluated and documentation done after procedure finished. Patient identified. Risks/Benefits/Options discussed with patient including but not limited to bleeding, infection, nerve damage, paralysis, failed block, incomplete pain control, headache, blood pressure changes, nausea, vomiting, reactions to medication both or allergic, itching and postpartum back pain. Confirmed with bedside nurse the patient's most recent platelet count. Confirmed with patient that they are not currently taking any anticoagulation, have any bleeding history or any family history of bleeding disorders. Patient expressed understanding and wished to proceed. All questions were answered. Sterile technique was used throughout the entire procedure. Please see nursing notes for vital signs. Test dose was given through epidural catheter and negative prior to  continuing to dose epidural or start infusion. Warning signs of high block given to the patient including shortness of breath, tingling/numbness in hands, complete motor block, or any concerning symptoms with instructions to call for help. Patient was given instructions on fall risk and not to get out of bed. All questions and concerns addressed with instructions to call with any issues or inadequate analgesia.    Patient tolerated the insertion well without immediate complications.Reason for block:procedure for pain

## 2022-11-27 NOTE — Anesthesia Preprocedure Evaluation (Addendum)
Anesthesia Evaluation  Patient identified by MRN, date of birth, ID band Patient awake    Reviewed: Allergy & Precautions, H&P , NPO status , Patient's Chart, lab work & pertinent test results  Airway Mallampati: II   Neck ROM: full    Dental no notable dental hx.    Pulmonary former smoker   Pulmonary exam normal        Cardiovascular negative cardio ROS Normal cardiovascular exam     Neuro/Psych  Headaches PSYCHIATRIC DISORDERS Anxiety Depression       GI/Hepatic Neg liver ROS,GERD  ,,  Endo/Other  negative endocrine ROS    Renal/GU negative Renal ROS  negative genitourinary   Musculoskeletal   Abdominal   Peds  Hematology  (+) Blood dyscrasia, anemia   Anesthesia Other Findings   Reproductive/Obstetrics (+) Pregnancy                             Anesthesia Physical Anesthesia Plan  ASA: 2  Anesthesia Plan: Epidural   Post-op Pain Management:    Induction:   PONV Risk Score and Plan:   Airway Management Planned:   Additional Equipment:   Intra-op Plan:   Post-operative Plan:   Informed Consent: I have reviewed the patients History and Physical, chart, labs and discussed the procedure including the risks, benefits and alternatives for the proposed anesthesia with the patient or authorized representative who has indicated his/her understanding and acceptance.       Plan Discussed with: Anesthesiologist and CRNA  Anesthesia Plan Comments: (Update: Fetal intolerance of labor. Will use epidural for CD. Pt consented. )       Anesthesia Quick Evaluation

## 2022-11-27 NOTE — Transfer of Care (Signed)
Immediate Anesthesia Transfer of Care Note  Patient: Mary Wall  Procedure(s) Performed: CESAREAN SECTION  Patient Location: PACU  Anesthesia Type:Epidural  Level of Consciousness: awake, alert , and oriented  Airway & Oxygen Therapy: Patient Spontanous Breathing  Post-op Assessment: Report given to RN and Post -op Vital signs reviewed and stable  Post vital signs: Reviewed and stable  Last Vitals:  Vitals Value Taken Time  BP 125/84 11/27/22 1910  Temp 36.4 C 11/27/22 1910  Pulse    Resp 14 11/27/22 1910  SpO2 100 % 11/27/22 1910    Last Pain:  Vitals:   11/27/22 1910  TempSrc: Oral  PainSc:       Patients Stated Pain Goal: 3 (11/27/22 1230)  Complications: No notable events documented.

## 2022-11-27 NOTE — Op Note (Signed)
Cesarean Section Operative Note   Name: Mary Wall MRN: [id number] Date of surgery: 11/27/2022   Pre-operative diagnosis: IUP at [redacted]w[redacted]d Fetal growth restriction with elevated umbilical artery dopplers Hx of vacuum assisted vaginal delivery Hx of forceps assisted vaginal delivery Marginal cord insertion  Post-operative diagnosis: Same s/p PLTCS Uterine atony   Procedure: Primary low-transverse cesarean section   Surgeon: Kathalene Frames, MD  Assistant: Chari Manning, CNM. No other capable assistant available, in surgery requiring high level assistant.   Anesthesia: Epidural  Indication: Non-reassuring fetal status remote from delivery Labor course: Cervix closed on admission 11/22 at 0600. Cook catheter placed at 0900 at 1 cm and pitocin started. Cook catheter removed at 1305 and 5 cm; AROM at that time. Changed to 6 cm at 1440. 3 min decel at 1555 and cervix remained unchanged, IUPC and FSE placed. Pitocin turned off and patient in hands and knees with improvement in FHT. Another 3 min decel at 1648 which improved with hands and knees. Pitocin has remained off. Given NRFHT remote from delivery and inability to safely induce in the setting of FGR with elevated UAD, discussed recommendation for cesarean section and patient in agreement.    Blood loss:  590cc Fluids: 300cc IVF UOP: 100cc  TOLAC candidate: Patient is a TOLAC candidate.   Complications: uterine atony requiring uterotonics Specimens: None   Findings: No fascial adhesions. No intra-abdominal adhesions. Normal uterus, fallopian tubes, bilateral ovaries. Large dark blood clots inside the uterus delivered with fetus.    Procedure: The patient was evaluated and consented prior to surgery. The risks, benefits, complications, treatment options, and expected outcomes were discussed with the patient. The patient concurred with the proposed plan, giving informed consent. The site of surgery was properly noted.     The patient was taken to Operating Room [facility] OR 02, identified as NARE KLINKO and the procedure verified as a C-Section Delivery.  A Time Out was held and the above information confirmed. Antibiotics were given: See MAR. Sequential compression devices were placed.    After induction of anesthesia, the patient was placed in a dorsal supine position with a left lateral tilt. A foley catheter was placed, and the patient was prepped and draped in the usual sterile manner. After confirming an adequate level of anesthesia, a pfannenstiel skin incision was made with a scalpel and Bovie cautery was used to carry down the incision through the subcutaneous tissue to the fascia. A fascial incision was made and extended transversely. The fascia was dissected off the rectus muscles superiorly and inferiorly. The peritoneum was identified and entered superiorly with blunt dissection.  The peritoneal incision was extended superiorly and inferiorly with good visualization of the bladder.  A low transverse uterine incision was made and bag of water was broken (clear). The caput was lodged deep into the pelvis and suction had to be broken to deliver the fetus. Viable female infant was delivered atraumatically. The umbilical cord was clamped and cut after 60 seconds, and the infant was then handed to the neonatal team. The placenta was removed with fundal massage and manual extraction. It was noted to be intact; disposed. Given O+ blood type, cord blood was collected. The uterine outline, tubes, and ovaries appeared normal other than the lower uterine segment was noted to be edematous. The uterus was exteriorized and cleared of all clots and debris. Large dark old clots were removed. A left extension of the hysterotomy was noted that went into the broad and the left  uterine artery. An partial Antony Salmon was required to obtain hemostasis. The extension was repaired by incorporating it into the hysterotomy closure. The uterine  incision was closed with running locked zero Vicryl suture. The uterus was noted to be boggy so methergine and TXA were given. A double layer closure technique was used. The lower uterine segment was noted to still be boggy so Hemabate was given. Hemostasis was observed and uterine tone improved.The uterus was replaced in the abdomen. The cul-de-sac and peritoneal cavity were irrigated and cleared of all clots and debris. Again hemostasis was noted.  The fascia was then reapproximated with running sutures of zero vicryl. Irrigation of the subcutaneous tissue was done with normal saline and hemostasis was obtained. 60cc of 0.25% bupivacaine was injected into the fascia, subcutaneous tissue, and skin for local anesthesia.  Subcutaneous tissue was reapproximated in a running fashion using 3-0 Monocryl suture. Finally, the skin was reapproximated with Insorb staples.  Telfa and pressure dressing were placed. Patient was left in stable condition and taken to [facility] PACU.   The instrument, sponge, and needle counts were correct prior to the abdominal closure and at the conclusion of the case.     Information for the patient's newborn:  Chelcie, Niedzielski [[id number]]  Live born female  Birth Weight: 4 lb 11 oz (2126 g) APGAR: 8, 9  Newborn Delivery   Birth date/time: 11/27/2022 18:08:00 Delivery type: C-Section, Low Transverse Trial of labor: Yes C-section categorization: Primary     Maternal condition: stable, to postpartum Newborn condition: stable, remains with mother

## 2022-11-27 NOTE — Progress Notes (Signed)
Labor Check  Subj:  has no unusual complaints  Obj:   Dose (milli-units/min) Oxytocin: 8 milli-units/min  Cervix: Dilation: 6 / Effacement (%): 70 / Station: -2  Baseline [id number]: 130 bpm, Variability: Good {> 6 bpm), Accelerations: Reactive, and Decelerations: Absent Contractions: regular, every 1-2 minutes Overall assessment: reassuring category 1   Current Vital Signs 24h Vital Sign Ranges  T 98.1 F (36.7 C) Temp  Avg: 98.6 F (37 C)  Min: 98.1 F (36.7 C)  Max: 99.1 F (37.3 C)  BP 126/68 BP  Min: 120/69  Max: 126/68  HR 73 Pulse  Avg: 81.7  Min: 73  Max: 98  RR 16 Resp  Avg: 17.7  Min: 16  Max: 20  SaO2 99 % Room Air SpO2  Avg: 99 %  Min: 99 %  Max: 99 %       Medications SCHEDULED MEDICATIONS   ammonia       misoprostol       misoprostol  25 mcg Oral Q4H   And   misoprostol  25 mcg Vaginal Q4H   oxytocin       oxytocin 40 units in LR 1000 mL  333 mL Intravenous Once    MEDICATION INFUSIONS   lactated ringers 500 mL (11/27/22 1403)   lactated ringers 1,000 mL (11/27/22 1330)   oxytocin     oxytocin 8 milli-units/min (11/27/22 1130)   pencillin G potassium IV     Followed by   pencillin G potassium IV      PRN MEDICATIONS  acetaminophen, ammonia, fentaNYL (SUBLIMAZE) injection, lactated ringers, lidocaine (PF), misoprostol, ondansetron, oxytocin, sodium citrate-citric acid, terbutaline    A/P: 32 y.o. [id number] female at [redacted]w[redacted]d with  IUGR and elevated UAD   1.  Labor: Satisfactory labor progress. and Significant vaginal bleeding bloody show  with removal of cooks cervical cath. pain controlled  Epidural and IV pain meds 2.  NWG:NFAOZHY assessment: Category I 3.  Group B Strep negative 4. Membranes ruptured, bloody fluid 5.  Pain: level of pain (1-10, 10 severe), 10/10 requesting epidural 6.  Recheck:Evaluated by digital exam. 7. Anticipate vaginal delivery.  Chari Manning [facility] 11/27/2022 3:46 PM

## 2022-11-28 LAB — CBC
HCT: 32.5 % — ABNORMAL LOW (ref 36.0–46.0)
Hemoglobin: 10.9 g/dL — ABNORMAL LOW (ref 12.0–15.0)
MCH: 28.5 pg (ref 26.0–34.0)
MCHC: 33.5 g/dL (ref 30.0–36.0)
MCV: 84.9 fL (ref 80.0–100.0)
Platelets: 246 10*3/uL (ref 150–400)
RBC: 3.83 MIL/uL — ABNORMAL LOW (ref 3.87–5.11)
RDW: 13.2 % (ref 11.5–15.5)
WBC: 18.6 10*3/uL — ABNORMAL HIGH (ref 4.0–10.5)
nRBC: 0 % (ref 0.0–0.2)

## 2022-11-28 LAB — BASIC METABOLIC PANEL
Anion gap: 8 (ref 5–15)
BUN: <5 mg/dL — ABNORMAL LOW (ref 6–20)
CO2: 21 mmol/L — ABNORMAL LOW (ref 22–32)
Calcium: 8.6 mg/dL — ABNORMAL LOW (ref 8.9–10.3)
Chloride: 103 mmol/L (ref 98–111)
Creatinine, Ser: 0.63 mg/dL (ref 0.44–1.00)
GFR, Estimated: >60 mL/min (ref >60)
Glucose, Bld: 129 mg/dL — ABNORMAL HIGH (ref 70–99)
Potassium: 3.6 mmol/L (ref 3.5–5.1)
Sodium: 132 mmol/L — ABNORMAL LOW (ref 135–145)

## 2022-11-28 MED ORDER — FERROUS SULFATE 325 (65 FE) MG PO TABS
325.0000 mg | ORAL_TABLET | Freq: Every day | ORAL | Status: DC
  Administered 2022-11-28 – 2022-11-30 (×3): 325 mg via ORAL
  Filled 2022-11-28 (×3): qty 1

## 2022-11-28 NOTE — Progress Notes (Signed)
Post Operative Day 1 Subjective: Doing well, no complaints.  Tolerating regular diet, pain with PO meds, voiding and ambulating without difficulty.  No CP SOB Fever,Chills, N/V or leg pain; denies nipple or breast pain, no HA change of vision, RUQ/epigastric pain  Objective: BP 114/71 (BP Location: Left Arm)   Pulse 60   Temp 98 F (36.7 C)   Resp 18   Ht 5\' 1"  (1.549 m)   Wt 71.7 kg   SpO2 100%   Breastfeeding Unknown   BMI 29.85 kg/m    Physical Exam:  General: NAD Breasts: soft/nontender  CV: RRR Pulm: nl effort, CTABL Abdomen: soft, NT, BS x 4 Incision: Dsg CDI/Pressure dressing intact/no erythema or drainage Lochia: moderate Uterine Fundus: fundus firm and 1 fb below umbilicus DVT Evaluation: no cords, ttp LEs   Recent Labs    11/27/22 0534 11/28/22 0511  HGB 10.0* 10.9*  HCT 29.0* 32.5*  WBC 7.6 18.6*  PLT 240 246   I/O last 3 completed shifts: In: 1307 [P.O.:300; I.V.:657; IV Piggyback:350] Out: 4140 [Urine:3500; Blood:640] No intake/output data recorded.   Assessment/Plan: 32 y.o. [id number] post-operative day # 1  - Continue routine PP care - Lactation consult PRN - Postpartum contraception: planning Nexplanon - Acute blood loss anemia, clinically insignificant - hemoglobin changed from 10.0 to 10.9, patient is asymptomatic, hemodynamically stable; start po ferrous sulfate daily with stool softeners - WBC 7.6 -> 18.6, afebrile, no tachycardia - Urine output in 16hrs. Foley catheter removed and voiding without difficulty.  - Immunization status: all Imms up to date  Disposition: Does not desire Dc home today.   [name], CNM 11/28/2022 11:24 AM

## 2022-11-28 NOTE — Lactation Note (Signed)
This note was copied from a baby's chart. Lactation Consultation Note  Patient Name: Mary Wall [id number] Date: 11/28/2022 Age:34 hours Reason for consult: Initial assessment;Early term 37-38.6wks;Other (Comment) (Baby in SCN)   Maternal Data Mom [id number] delivered by C/S for FHR indication. Mom with history of anxiety, anemia, depression, right breast lump, THC use. Baby is 37 weeks, SGA, in SCN r/t RDS in newborn.  At first visit mom reports she has pumped twice and is getting measurable amounts. Mom is concerned her 2nd pump session was not as much as her first pump session. Does the patient have breastfeeding experience prior to this delivery?: Yes How long did the patient breastfeed?: Mom reports 1 month experience  Feeding Mother's Current Feeding Choice: Breast Milk and Formula   Lactation Tools Discussed/Used Tools: Pump Breast pump type: Double-Electric Breast Pump Pump Education: Setup, frequency, and cleaning;Milk Storage Reason for Pumping: Baby in SCN Pumping frequency: 8 times/24 hours Pumped volume:  (per mom she has pumped twice and is getting measurable amounts)  Interventions Interventions: Education Reviewed with mom tips and strategies to maximize pumping and what to expect in early days when breastpumping. Educated mom on normative milk volumes.  Discharge Pump: WIC Loaner (Personal: Even Flo) WIC Program: Yes Faxed WIC referral to [employer]. Mom understands she can obtain hospital grade breastpump from [facility].  Consult Status Consult Status: Follow-up Date: 11/29/22 Follow-up type: In-patient  Update provided to care nurse.  Fuller Song 11/28/2022, 6:17 PM

## 2022-11-28 NOTE — Anesthesia Postprocedure Evaluation (Signed)
Anesthesia Post Note  Patient: Mary Wall  Procedure(s) Performed: CESAREAN SECTION  Patient location during evaluation: Mother Baby Anesthesia Type: Epidural Level of consciousness: oriented and awake and alert Pain management: pain level controlled Vital Signs Assessment: post-procedure vital signs reviewed and stable Respiratory status: spontaneous breathing and respiratory function stable Cardiovascular status: blood pressure returned to baseline and stable Postop Assessment: no headache, no backache, no apparent nausea or vomiting and able to ambulate Anesthetic complications: no   No notable events documented.   Last Vitals:  Vitals:   11/28/22 0330 11/28/22 0903  BP: 126/88 114/71  Pulse: 64 60  Resp: 18   Temp: 36.6 C 36.7 C  SpO2: 100% 100%    Last Pain:  Vitals:   11/28/22 0819  TempSrc:   PainSc: 3                  Foye Deer

## 2022-11-29 LAB — CBC
HCT: 24.8 % — ABNORMAL LOW (ref 36.0–46.0)
Hemoglobin: 8.5 g/dL — ABNORMAL LOW (ref 12.0–15.0)
MCH: 28.4 pg (ref 26.0–34.0)
MCHC: 34.3 g/dL (ref 30.0–36.0)
MCV: 82.9 fL (ref 80.0–100.0)
Platelets: 218 10*3/uL (ref 150–400)
RBC: 2.99 MIL/uL — ABNORMAL LOW (ref 3.87–5.11)
RDW: 13.5 % (ref 11.5–15.5)
WBC: 10.3 10*3/uL (ref 4.0–10.5)
nRBC: 0 % (ref 0.0–0.2)

## 2022-11-29 MED ORDER — IRON SUCROSE 300 MG IVPB - SIMPLE MED
300.0000 mg | Freq: Once | Status: DC
  Filled 2022-11-29: qty 265

## 2022-11-29 MED ORDER — ACETAMINOPHEN 500 MG PO TABS
1000.0000 mg | ORAL_TABLET | Freq: Four times a day (QID) | ORAL | Status: DC | PRN
  Administered 2022-11-29: 1000 mg via ORAL
  Filled 2022-11-29 (×2): qty 2

## 2022-11-29 NOTE — Progress Notes (Signed)
Client refused IV Iron infusion. Donato Schultz, CNM notified. VSS and no complaints of dizziness.

## 2022-11-29 NOTE — Progress Notes (Signed)
Post Operative Day 2 Subjective: Doing well, no complaints.  Tolerating regular diet, pain with PO meds, voiding and ambulating without difficulty.  No CP SOB Fever,Chills, N/V or leg pain; denies nipple or breast pain, no HA change of vision, RUQ/epigastric pain  Objective: BP 111/80 (BP Location: Left Arm)   Pulse 77   Temp 98.3 F (36.8 C) (Oral)   Resp 19   Ht 5\' 1"  (1.549 m)   Wt 71.7 kg   SpO2 100%   Breastfeeding Unknown   BMI 29.85 kg/m    Physical Exam:  General: NAD Breasts: soft/nontender  CV: RRR Pulm: nl effort, CTABL Abdomen: soft, NT, BS x 4 Incision:  Dsg CDI/pressure dressing intact/no erythema or drainage Lochia: moderate Uterine Fundus: fundus firm and 2 fb below umbilicus DVT Evaluation: no cords, ttp LEs   Recent Labs    11/28/22 0511 11/29/22 0910  HGB 10.9* 8.5*  HCT 32.5* 24.8*  WBC 18.6* 10.3  PLT 246 218    Assessment/Plan: 32 y.o. [id number] post-operative day # 1  - Continue routine PP care - Lactation consult PRN  - Discussed contraceptive options including implant, IUDs hormonal and non-hormonal, injection, pills/ring/patch, condoms, and NFP.  - Acute blood loss anemia, clinically significant - hemoglobin changed from 10.0 to 10.9 to 8.5, patient is asymptomatic, hemodynamically stable; start po ferrous sulfate BID with stool softeners, administer Venofer IV  - Immunization status: all Imms up to date  Disposition: Does not desire Dc home today.   [name], CNM 11/29/2022 10:10 AM

## 2022-11-29 NOTE — Lactation Note (Signed)
This note was copied from a baby's chart. Lactation Consultation Note  Patient Name: Mary Wall [id number] Date: 11/29/2022 Age:34 hours  Reason for Consult: Follow-up assessment, Early Term, Other(baby in SCN)   Maternal Data  Mom [phone] delivered by C/S for FHR indication. Mom with history of anxiety, anemia, depression, right breast lump, THC use. Baby is 37 weeks, SGA, in SCN r/t RDS in newborn.   At follow-up visit mom reports she is concerned  she is not seeing increased volumes of colostrum when she pumps.  Does the patient have breastfeeding experience prior to this delivery?: Yes How long did the patient breastfeed?: Mom reports 1 month experience Feeding  Mother's Current Feeding Choice: Breast Milk and Formula    Lactation Tools Discussed/Used  DEBP  Interventions  Education: Reviewed with mom tips and strategies to maximize pumping and what to expect in early days when breastpumping. Reviewed with mom normative milk volumes in the early post partum period. Reassurance provided mom's milk volumes are within the normative range. Support and encouragement provided to continue to goal for 8 pump sessions in 24 hours.  Discharge  Pump: WIC Loaner (Personal: Even Flo) WIC Program: Yes Faxed WIC referral to [employer]. Mom understands she can obtain hospital grade breastpump from [facility].  Consult Status  Consult Status: Follow-up Date: 11/30/22 Follow-up type: In-patient  Update provided to care nurse.  Fuller Song 11/29/2022, 2:42 PM

## 2022-11-30 ENCOUNTER — Encounter: Payer: Self-pay | Admitting: Oncology

## 2022-11-30 ENCOUNTER — Encounter: Payer: Self-pay | Admitting: Obstetrics and Gynecology

## 2022-11-30 MED ORDER — ACETAMINOPHEN 500 MG PO TABS: 1000.0000 mg | ORAL_TABLET | Freq: Four times a day (QID) | ORAL | Status: AC | PRN

## 2022-11-30 MED ORDER — SENNA 8.6 MG PO TABS: 1.0000 | ORAL_TABLET | Freq: Every day | ORAL | Status: AC

## 2022-11-30 MED ORDER — FERROUS SULFATE 325 (65 FE) MG PO TABS: 325.0000 mg | ORAL_TABLET | Freq: Every day | ORAL | Status: AC

## 2022-11-30 MED ORDER — SENNA 8.6 MG PO TABS: 1.0000 | ORAL_TABLET | Freq: Every day | ORAL | Status: DC

## 2022-11-30 MED ORDER — OXYCODONE HCL 5 MG PO TABS: 5.0000 mg | ORAL_TABLET | ORAL | 0 refills | Status: AC | PRN

## 2022-11-30 MED ORDER — IBUPROFEN 600 MG PO TABS: 600.0000 mg | ORAL_TABLET | Freq: Four times a day (QID) | ORAL | 1 refills | Status: AC | PRN

## 2022-11-30 MED ORDER — POLYETHYLENE GLYCOL 3350 17 G PO PACK: 17.0000 g | PACK | Freq: Every day | ORAL | Status: AC

## 2022-11-30 MED ORDER — POLYETHYLENE GLYCOL 3350 17 G PO PACK: 17.0000 g | PACK | Freq: Every day | ORAL | Status: DC

## 2022-11-30 NOTE — Progress Notes (Signed)
TOC consult for substance use, TOC aware MOB tested positive for marijuana, child's urine drug screen negative for all substances. CSW has reached out to confirm this with RN.   Per [employer] and [employer], the Child Abuse Preventions Treatment ACT or CAPTA requires healthcare providers to notify CPS of all substance affected infants  A substance affected infant is classified as "An infant has a positive urine, meconium or cord segment drug screen "  Infant does not meet criteria of above as negative drug screen, no other concerns noted a this time.   SA resources added to MOB AVS.   Darolyn Rua, LCSW, MSW, [place] [phone]

## 2022-11-30 NOTE — Discharge Instructions (Addendum)
Cesarean Delivery, Care After Refer to this sheet in the next few weeks. These instructions provide you with information on caring for yourself after your procedure. Your health care provider may also give you specific instructions. Your treatment has been planned according to current medical practices, but problems sometimes occur. Call your health care provider if you have any problems or questions after you go home. HOME CARE INSTRUCTIONS  Please leave honey comb dressing (OP Site) on for 7 days.  You may shower during this period but turn your back to the water so that the dressing does not get directly saturated by the water.   You may take the dressing off on day 7.  The easiest way to do it is in the shower.  Allow the water to run over the dressing and it usually comes off easier.   Only take over-the-counter or prescription medications as directed by your health care provider. Do not drink alcohol, especially if you are breastfeeding or taking medication to relieve pain. Do not  smoke tobacco. Continue to use good perineal care. Good perineal care includes: Wiping your perineum from front to back. Keeping your perineum clean. Check your surgical cut (incision) daily for increased redness, drainage, swelling, or separation of skin. Shower and clean your incision gently with soap and water every day, by letting warm and soapy water run over the incision, and then pat it dry. If your health care provider says it is okay, leave the incision uncovered. Use a bandage (dressing) if the incision is draining fluid or appears irritated. If the adhesive strips across the incision do not fall off within 7 days, carefully peel them off, after a shower. Hug a pillow when coughing or sneezing until your incision is healed. This helps to relieve pain. Do not use tampons, douches or have sexual intercourse, until your health care provider says it is okay. Wear a well-fitting bra that provides breast  support. Limit wearing support panties or control-top hose. Drink enough fluids to keep your urine clear or pale yellow. Eat high-fiber foods such as whole grain cereals and breads, brown rice, beans, and fresh fruits and vegetables every day. These foods may help prevent or relieve constipation. Resume activities such as climbing stairs, driving, lifting, exercising, or traveling as directed by your health care provider. Try to have someone help you with your household activities and your newborn for at least a few days after you leave the hospital. Rest as much as possible. Try to rest or take a nap when your newborn is sleeping. Increase your activities gradually. Do not lift more than 15lbs until directed by a provider. Keep all of your scheduled postpartum appointments. It is very important to keep your scheduled follow-up appointments. At these appointments, your health care provider will be checking to make sure that you are healing physically and emotionally. SEEK MEDICAL CARE IF:  You are passing large clots from your vagina. Save any clots to show your health care provider. You have a foul smelling discharge from your vagina. You have trouble urinating. You are urinating frequently. You have pain when you urinate. You have a change in your bowel movements. You have increasing redness, pain, or swelling near your incision. You have pus draining from your incision. Your incision is separating. You have painful, hard, or reddened breasts. You have a severe headache. You have blurred vision or see spots. You feel sad or depressed. You have thoughts of hurting yourself or your newborn. You have questions  about your care, the care of your newborn, or medications. You are dizzy or light-headed. You have a rash. You have pain, redness, or swelling at the site of the removed intravenous access (IV) tube. You have nausea or vomiting. You stopped breastfeeding and have not had a  menstrual period within 12 weeks of stopping. You are not breastfeeding and have not had a menstrual period within 12 weeks of delivery. You have a fever. SEEK IMMEDIATE MEDICAL CARE IF: You have persistent pain. You have chest pain. You have shortness of breath. You faint. You have leg pain. You have stomach pain. Your vaginal bleeding saturates 2 or more sanitary pads in 1 hour. MAKE SURE YOU:  Understand these instructions. Will watch your condition. Will get help right away if you are not doing well or get worse. Document Released: 09/13/2001 Document Revised: 05/08/2013 Document Reviewed: 08/19/2011 [facility] Patient Information 2015 [place], [place]. This information is not intended to replace advice given to you by your health care provider. Make sure you discuss any questions you have with your health care provider.     Intensive Outpatient Programs   [facility] [address] [place],  [place], [place] [phone]  [facility] (Inpatient and [phone] (Suboxone and Methadone) [address] [phone]  [facility] - Intensive Outpatient [address] [place], [place] [address], [place]  [phone]  [facility] (Outpatient, Inpatient, [facility] (Groups and Residental) (insurance only) [phone] [place], [place] [phone]   [facility] (for caregivers and family) [address] Laurell Josephs [address], [place], [place] [phone]  Residential Treatment Programs  [facility](2 years) Residential: 72 days)[facility] [address] [place], [place],  [place] [phone] or [phone]  [facility] [address] [place], [place], [place] [phone]  [facility] ([facility] [address] [place], [place], [place] [phone] Admissions: 8am-3pm M-F  [facility] Program: Residential Program [phone] Days)             [facility]: [facility]  [place], [place], [place]  [phone] or [phone] in Hours over the weekend or by referral)  [facility] [address] [place], [place] [address] [phone] (Do virtual or phone assessment, offer transportation within 25 miles, have in patient and Outpatient options)   Mobil Crisis: [facility]:[phone] (for crisis response 24 hours a day)

## 2022-12-02 ENCOUNTER — Ambulatory Visit: Payer: Self-pay

## 2022-12-02 NOTE — Lactation Note (Signed)
This note was copied from a baby's chart. Lactation Consultation Note  Patient Name: Mary Wall Date: 12/02/2022 Age:34 days     Maternal Data Met w/ mom of baby girl "Mary Wall" on at bedside in NICU.  MOB stated that her pumping sessions are going good.  She is pumping 3-4oz a session and pumping every 2-3hrs even through the night.    Mom had one question for lactation, "does STS help with milk production.  Anytime I STS even after an hr of pumping, my breast start to leak."  Lactation Tools Discussed/Used Tools: 63F feeding tube / Syringe  Interventions Lactation discussed the benefits of STS with parent.  Mom received [employer] Support Group information.    Yvette Rack Tanay Massiah 12/02/2022, 1:22 PM

## 2022-12-05 ENCOUNTER — Ambulatory Visit: Payer: Self-pay

## 2022-12-05 NOTE — Lactation Note (Signed)
This note was copied from a baby's chart. Lactation Consultation Note  Patient Name: Mary Wall Date: 12/05/2022 Age:34 days Reason for consult: Follow-up assessment;1st time breastfeeding;Early term 37-38.6wks;NICU baby   Maternal Data Has patient been taught Hand Expression?: Yes Does the patient have breastfeeding experience prior to this delivery?: Yes How long did the patient breastfeed?: attempted, baby's wouldn't latch, youngest is 73 yrs old  Feeding Mother's Current Feeding Choice: Breast Milk Nipple Type: Dr. Levert Feinstein Pre[facility]ie Called to assist mom with latching  baby to breast with nipple shield, baby was being bottlefed but had attempted before bottlefeeding to latch to breast without shield, but became fussy and would not suck, 20 mm nipple shield placed for sizing, may need to try 24 mm shield, baby came away from bottle by dad, so attempt at breastfeeding made, mom had not pumped since 1300 and breast is full. Recommended trying at next attempt with a pumped breast, baby would attempt to latch several times in cradle position and then football hold and all times would take nipple and shield in mouth, but would not suck and then pull away and cry, attempts ended and bottlefeeding began easily after.   Mom would like to try again tomorrow and I recommended prepumping to decrease amount of milk to let down and not overwhelm baby with fast flow of milk.        LATCH Score Latch: Repeated attempts needed to sustain latch, nipple held in mouth throughout feeding, stimulation needed to elicit sucking reflex.  Audible Swallowing: None  Type of Nipple: Everted at rest and after stimulation  Comfort (Breast/Nipple): Filling, red/small blisters or bruises, mild/mod discomfort (breasts full, mom had not pumped since 1300)  Hold (Positioning): Assistance needed to correctly position infant at breast and maintain latch.  LATCH Score: 5   Lactation Tools  Discussed/Used Tools: Nipple Shields Nipple shield size: 20  Interventions Interventions: Assisted with latch;Adjust position;Support pillows  Discharge    Consult Status Consult Status: Follow-up Date: 12/06/22 Follow-up type: In-patient    Dyann Kief 12/05/2022, 5:34 PM

## 2022-12-14 ENCOUNTER — Emergency Department: Admission: EM | Admit: 2022-12-14 | Discharge: 2022-12-14 | Discharge status: Against Medical Advice | Payer: 59

## 2022-12-14 NOTE — ED Notes (Signed)
No answer when called several times from lobby 

## 2023-01-15 DIAGNOSIS — I10 Essential (primary) hypertension: Secondary | ICD-10-CM | POA: Diagnosis not present

## 2023-01-25 ENCOUNTER — Ambulatory Visit: Payer: 59 | Admitting: Dietician

## 2024-03-21 IMAGING — CT CT ABD-PELV W/ CM
2 of 4 series · 16 of 46 positions shown, 18 images · IV contrast (agent unspecified)
Comparison: None.

CLINICAL DATA: Nausea, vomiting and abdominal pain. Also indicates
bright bright red emesis and black stools.

EXAM:
CT ABDOMEN AND PELVIS WITH CONTRAST
TECHNIQUE: Multidetector CT imaging of the abdomen and pelvis was performed
using the standard protocol following bolus administration of
intravenous contrast.

[Series 2: abdomen 5.0 · axial · 0.76mm/px · z∈[-919,-549]mm · 13 of 82 slices shown, 15 images]
[im 4/82  soft-tissue]
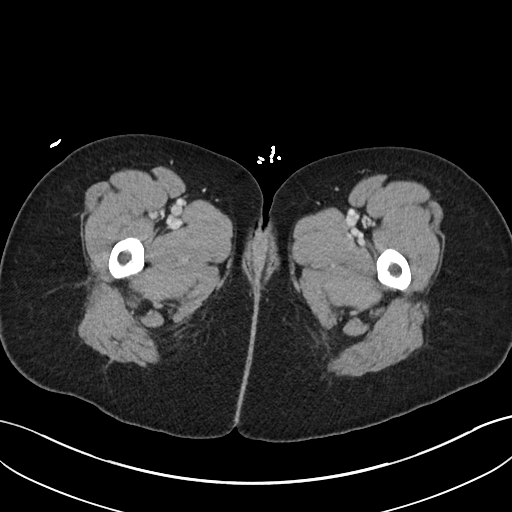
[im 4/82  bone]
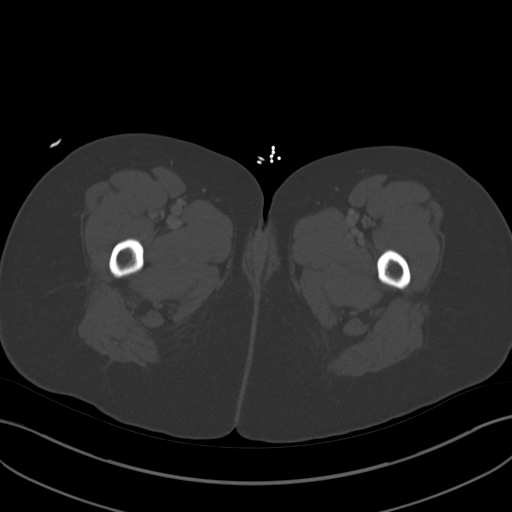
[im 12/82  soft-tissue]
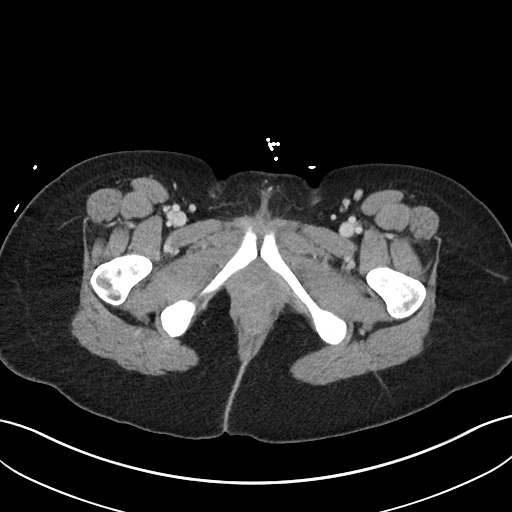
[im 16/82  soft-tissue]
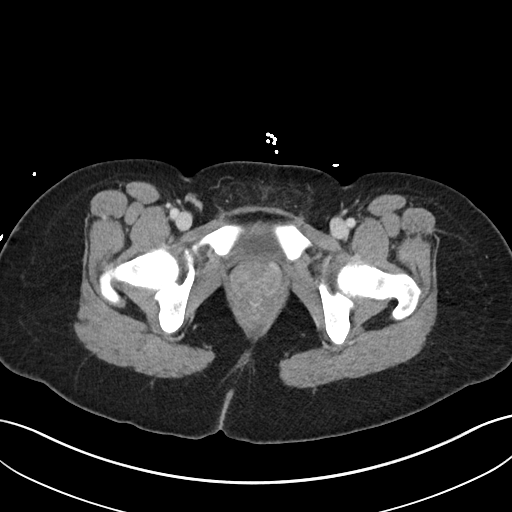
[im 24/82  soft-tissue]
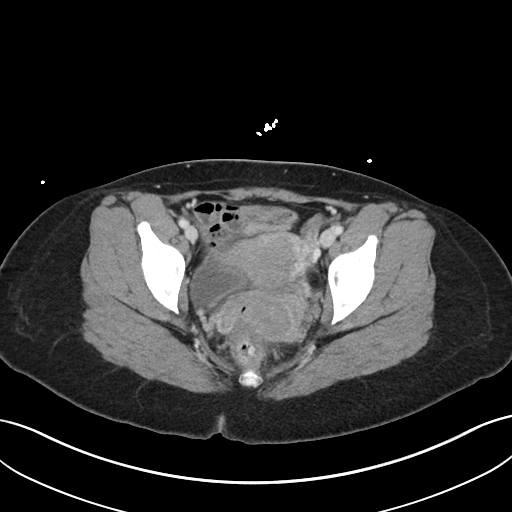
[im 28/82  soft-tissue]
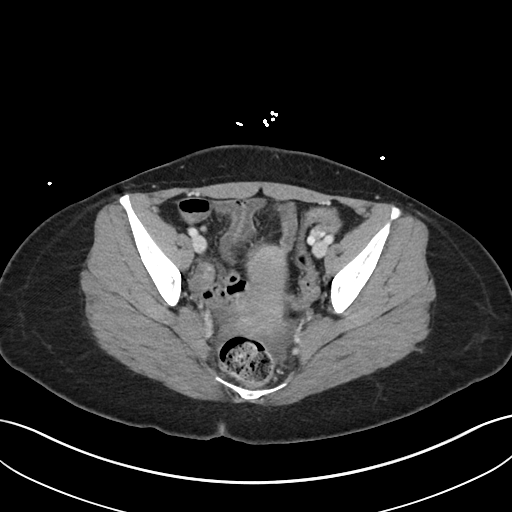
[im 35/82  soft-tissue]
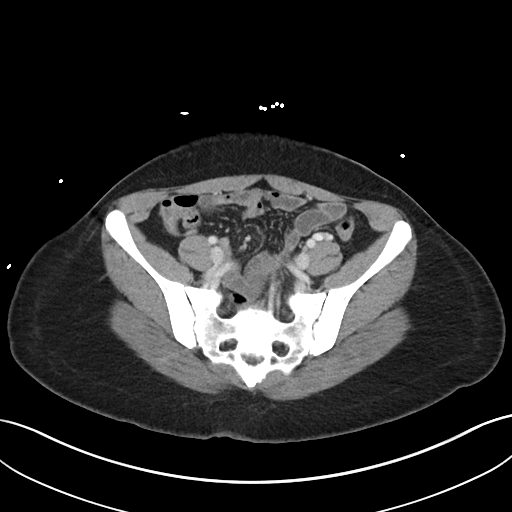
[im 43/82  soft-tissue]
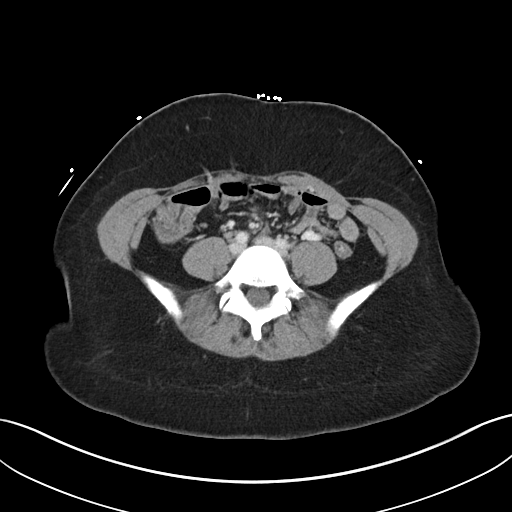
[im 47/82  soft-tissue]
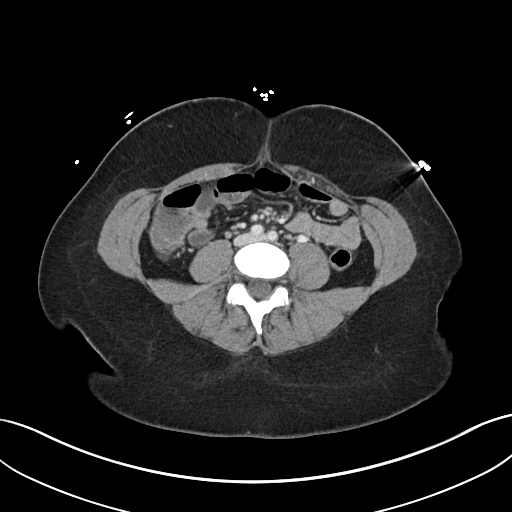
[im 55/82  soft-tissue]
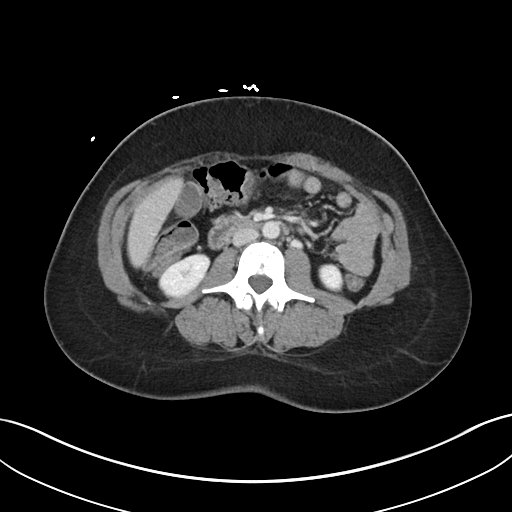
[im 55/82  bone]
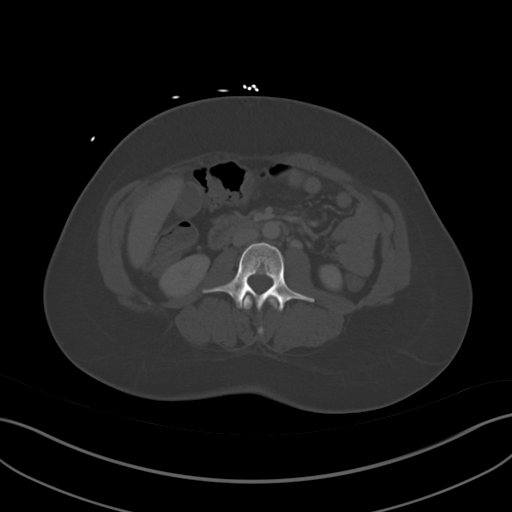
[im 58/82  soft-tissue]
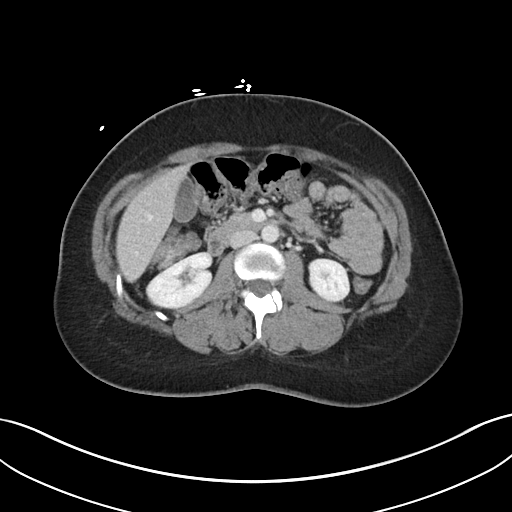
[im 66/82  soft-tissue]
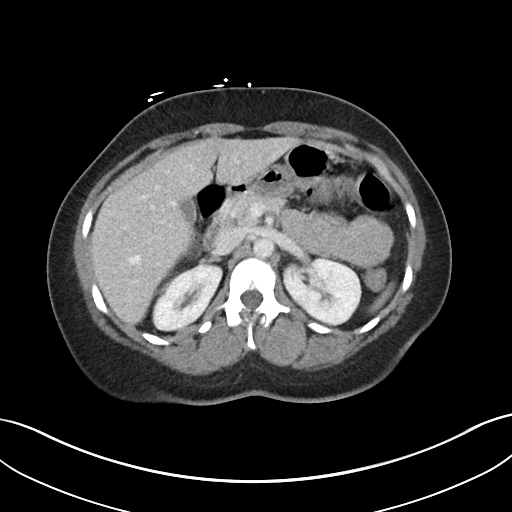
[im 70/82  soft-tissue]
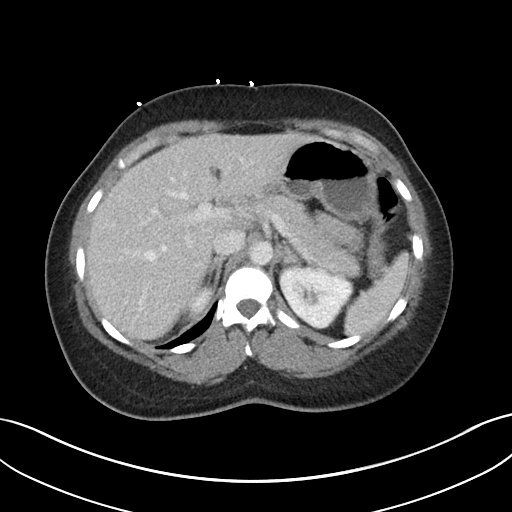
[im 78/82  soft-tissue]
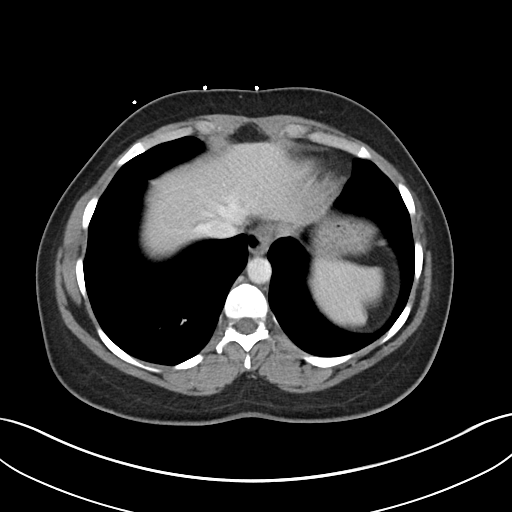

[Series 5: abdomen 3.0 mpr cor · coronal · 0.80mm/px · 3 of 77 slices shown]
[im 26/77  soft-tissue]
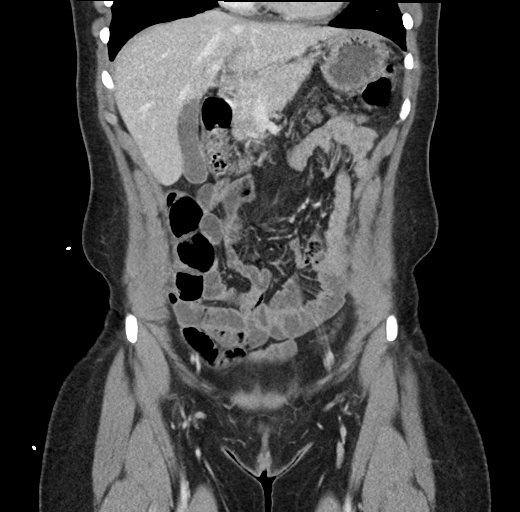
[im 34/77  soft-tissue]
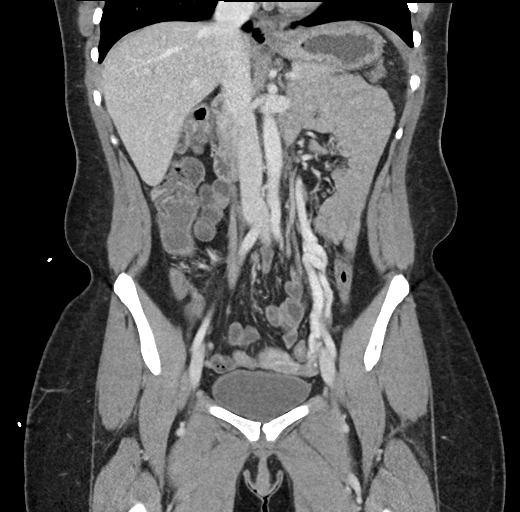
[im 43/77  soft-tissue]
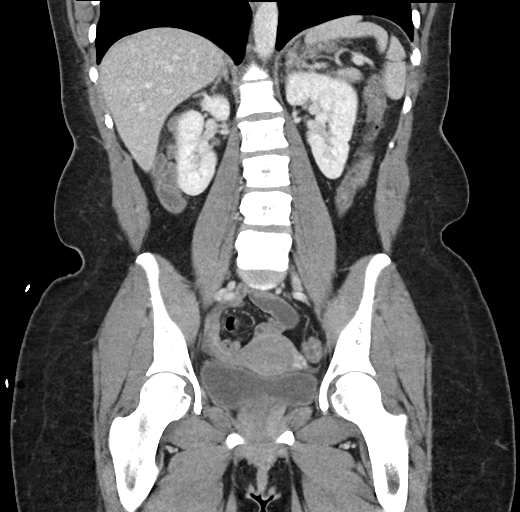

[16 of 46 positions shown; findings below may reference images not displayed]

RADIATION DOSE REDUCTION: This exam was performed according to the
departmental dose-optimization program which includes automated
exposure control, adjustment of the mA and/or kV according to
patient size and/or use of iterative reconstruction technique.

CONTRAST:  80mL OMNIPAQUE IOHEXOL 300 MG/ML  SOLN
FINDINGS: Lower chest: No acute abnormality.

Hepatobiliary: The gallbladder common bile ducts and hepatic
parenchyma are unremarkable.

Pancreas: Unremarkable.

Spleen: Normal in size with uniform enhancement.

Adrenals/Urinary Tract: There is no adrenal mass or focal
abnormality of the renal cortex. No evidence of urinary stones or
obstruction. No bladder thickening is seen.

Stomach/Bowel: Small hiatal hernia. Unremarkable gastric wall and
unopacified small bowel. There is a normal appendix. There is fluid
in the ascending colon. There are thickened folds versus
nondistention in the mid/distal transverse colon. Left colonic
diverticula without inflammatory changes.

Vascular/Lymphatic: There is left-sided pelvic venous congestion
extending to the outer left uterine wall. No other significant
vascular findings. There are no enlarged abdominal or pelvic nodes.

Reproductive: The uterus is intact. Both ovaries are normal in size.

Other: There is a small volume of pelvic cul-de-sac low-density
fluid extending to the posterior right adnexa. There is no free
hemorrhage, free air or abscess. Small umbilical fat hernia.

Musculoskeletal: No acute or significant osseous findings. Slight
lumbar levoscoliosis. Mild symmetric features of likely chronic
sacroiliitis.
IMPRESSION: 1. Transverse colitis versus nondistention, with scattered
diverticulosis. Fluid in the ascending colon.
2. No small bowel obstruction or inflammatory changes.
3. Small hiatal hernia, small umbilical fat hernia.
4. Left pelvic venous congestion.
5. Small volume of low-density pelvic cul-de-sac fluid, nonspecific
but frequently physiologic at this age. No free air.
6. Bilateral sacroiliitis, likely chronic.
